# Patient Record
Sex: Female | Born: 1960 | Race: Black or African American | Hispanic: No | Marital: Single | Smoking: Current every day smoker
Health system: Southern US, Community
[De-identification: ages and names within clinical notes are randomized; demographics above are authoritative.]

## PROBLEM LIST (undated history)

## (undated) DIAGNOSIS — F4320 Adjustment disorder, unspecified: Secondary | ICD-10-CM

## (undated) DIAGNOSIS — N1831 Chronic kidney disease, stage 3a: Secondary | ICD-10-CM

## (undated) DIAGNOSIS — F1010 Alcohol abuse, uncomplicated: Secondary | ICD-10-CM

## (undated) DIAGNOSIS — F17200 Nicotine dependence, unspecified, uncomplicated: Secondary | ICD-10-CM

## (undated) DIAGNOSIS — F32A Depression, unspecified: Secondary | ICD-10-CM

## (undated) HISTORY — PX: ECTOPIC PREGNANCY SURGERY: SHX613

---

## 2005-07-15 ENCOUNTER — Emergency Department (HOSPITAL_COMMUNITY): Admission: EM | Admit: 2005-07-15 | Discharge: 2005-07-15 | Payer: Self-pay | Admitting: Emergency Medicine

## 2005-07-23 ENCOUNTER — Emergency Department (HOSPITAL_COMMUNITY): Admission: EM | Admit: 2005-07-23 | Discharge: 2005-07-23 | Payer: Self-pay | Admitting: Emergency Medicine

## 2005-08-07 ENCOUNTER — Emergency Department (HOSPITAL_COMMUNITY): Admission: EM | Admit: 2005-08-07 | Discharge: 2005-08-07 | Payer: Self-pay | Admitting: Emergency Medicine

## 2005-08-31 ENCOUNTER — Ambulatory Visit (HOSPITAL_COMMUNITY): Admission: RE | Admit: 2005-08-31 | Discharge: 2005-09-01 | Payer: Self-pay | Admitting: Otolaryngology

## 2006-09-12 ENCOUNTER — Emergency Department: Payer: Self-pay | Admitting: Emergency Medicine

## 2006-09-16 ENCOUNTER — Ambulatory Visit: Payer: Self-pay | Admitting: Plastic Surgery

## 2006-10-01 ENCOUNTER — Emergency Department: Payer: Self-pay | Admitting: Unknown Physician Specialty

## 2006-10-08 ENCOUNTER — Inpatient Hospital Stay: Payer: Self-pay | Admitting: Obstetrics & Gynecology

## 2011-12-06 ENCOUNTER — Emergency Department: Payer: Self-pay | Admitting: Emergency Medicine

## 2012-09-22 ENCOUNTER — Emergency Department: Payer: Self-pay | Admitting: Emergency Medicine

## 2012-09-22 LAB — URINALYSIS, COMPLETE
Glucose,UR: NEGATIVE mg/dL (ref 0–75)
Ketone: NEGATIVE
Ph: 6 (ref 4.5–8.0)
Protein: NEGATIVE
RBC,UR: 2 /HPF (ref 0–5)
WBC UR: 28 /HPF (ref 0–5)

## 2016-05-27 ENCOUNTER — Emergency Department
Admission: EM | Admit: 2016-05-27 | Discharge: 2016-05-27 | Disposition: A | Discharge status: Home / Self Care | Payer: Self-pay | Attending: Emergency Medicine | Admitting: Emergency Medicine

## 2016-05-27 ENCOUNTER — Encounter: Payer: Self-pay | Admitting: Emergency Medicine

## 2016-05-27 DIAGNOSIS — K047 Periapical abscess without sinus: Secondary | ICD-10-CM | POA: Insufficient documentation

## 2016-05-27 DIAGNOSIS — F17210 Nicotine dependence, cigarettes, uncomplicated: Secondary | ICD-10-CM | POA: Insufficient documentation

## 2016-05-27 MED ORDER — AMOXICILLIN 500 MG PO TABS: 500.0000 mg | ORAL_TABLET | Freq: Three times a day (TID) | ORAL | Status: DC

## 2016-05-27 MED ORDER — IBUPROFEN 600 MG PO TABS: 600.0000 mg | ORAL_TABLET | Freq: Four times a day (QID) | ORAL | Status: DC | PRN

## 2016-05-27 MED ORDER — MAGIC MOUTHWASH W/LIDOCAINE: 5.0000 mL | Freq: Four times a day (QID) | ORAL | Status: DC | PRN

## 2016-05-27 NOTE — ED Provider Notes (Signed)
[facility] Emergency Department Provider Note  ____________________________________________  Time seen: Approximately 11:13 AM  I have reviewed the triage vital signs and the nursing notes.   HISTORY  Chief Complaint Oral Swelling and Dental Pain    HPI Karen Mooney is a 55 y.o. female , NAD, presents to the emergency department with 3 day history of right lower dental pain with swelling. States she has had infections about her right lower gumline and teeth off and on for some time. Unfortunately does not have dental insurance to be able to see a dentist for further care. Over the last few days has noted increased swelling about the lower gumline with increasing pain. Has not been able to chew food on the right side due to the pain. Has been able to chew on the left side without any discomfort. Has not taken anything over-the-counter at this time. Denies any fevers, chills, body aches. Denies any injury or trauma to the face or teeth.   History reviewed. No pertinent past medical history.  There are no active problems to display for this patient.   Past Surgical History  Procedure Laterality Date  . Ectopic pregnancy surgery      Current Outpatient Rx  Name  Route  Sig  Dispense  Refill  . amoxicillin (AMOXIL) 500 MG tablet   Oral   Take 1 tablet (500 mg total) by mouth 3 (three) times daily with meals.   30 tablet   0   . ibuprofen (ADVIL,MOTRIN) 600 MG tablet   Oral   Take 1 tablet (600 mg total) by mouth every 6 (six) hours as needed.   30 tablet   0   . magic mouthwash w/lidocaine SOLN   Oral   Take 5 mLs by mouth 4 (four) times daily as needed for mouth pain.   240 mL   0     Please mix 80mL diphenhydramine, 80mL nystatin, 80 ...     Allergies Review of patient's allergies indicates no known allergies.  History reviewed. No pertinent family history.  Social History Social History  Substance Use Topics  . Smoking status:  Current Every Day Smoker -- 1.00 packs/day    Types: Cigarettes  . Smokeless tobacco: None  . Alcohol Use: Yes     Review of Systems  Constitutional: No fever/chills ENT: Positive right lower dental pain causing right ear pain.  Cardiovascular: No chest pain. Respiratory: No shortness of breath.  Gastrointestinal: No abdominal pain.  No nausea, vomiting. Musculoskeletal: Positive right lower jaw pain. Negative for neck pain.  Skin: Positive swelling right lower jaw. Negative for rash, skin sores. Neurological: Negative for headaches, focal weakness or numbness. No tingling 10-point ROS otherwise negative.  ____________________________________________   PHYSICAL EXAM:  VITAL SIGNS: ED Triage Vitals  Enc Vitals Group     BP 05/27/16 1106 149/79 mmHg     Pulse Rate 05/27/16 1106 93     Resp 05/27/16 1106 18     Temp 05/27/16 1106 98.4 F (36.9 C)     Temp Source 05/27/16 1106 Oral     SpO2 05/27/16 1106 98 %     Weight 05/27/16 1106 129 lb (58.514 kg)     Height 05/27/16 1106  (1.676 m)     Head Cir --      Peak Flow --      Pain Score 05/27/16 1107 10     Pain Loc --      Pain Edu? --  Excl. in GC? --      Constitutional: Alert and oriented. Well appearing and in no acute distress. Eyes: Conjunctivae are normal.  Head: Atraumatic. ENT:            Nose: No congestion/rhinnorhea.      Mouth/Throat: Poor dentition with multiple cavities throughout the mouth. Right lower gumline mildly erythematous with moderate swelling on the lateral right lower gumline. Active oozing or weeping. No broken teeth.  Neck: Supple with full range of motion. Hematological/Lymphatic/Immunilogical: No cervical lymphadenopathy. Respiratory: Normal respiratory effort without tachypnea or retractions.  Musculoskeletal: Mild tenderness to palpation over the right lower jawline with mild focal swelling about the area of tooth #29-31. Neurologic:  Normal speech and language. No gross  focal neurologic deficits are appreciated. Gait and posture are normal Skin:  Skin is warm, dry and intact. No rash noted. Psychiatric: Mood and affect are normal. Speech and behavior are normal. Patient exhibits appropriate insight and judgement.   ____________________________________________   LABS  None ____________________________________________  EKG  None ____________________________________________  RADIOLOGY  None ____________________________________________    PROCEDURES  Procedure(s) performed: None   Medications - No data to display   ____________________________________________   INITIAL IMPRESSION / ASSESSMENT AND PLAN / ED COURSE  Patient's diagnosis is consistent with dental abscess. Patient will be discharged home with prescriptions for amoxicillin, Magic mouthwash with lidocaine and ibuprofen to take as directed. Patient is to follow up with [facility] in 48 hours for recheck. Patient is given ED precautions to return to the ED for any worsening or new symptoms.    ____________________________________________  FINAL CLINICAL IMPRESSION(S) / ED DIAGNOSES  Final diagnoses:  Dental abscess      NEW MEDICATIONS STARTED DURING THIS VISIT:  New Prescriptions   AMOXICILLIN (AMOXIL) 500 MG TABLET    Take 1 tablet (500 mg total) by mouth 3 (three) times daily with meals.   IBUPROFEN (ADVIL,MOTRIN) 600 MG TABLET    Take 1 tablet (600 mg total) by mouth every 6 (six) hours as needed.   MAGIC MOUTHWASH W/LIDOCAINE SOLN    Take 5 mLs by mouth 4 (four) times daily as needed for mouth pain.         Hope PigeonJami L Jaila Schellhorn, PA-C 05/27/16 1128  Myrna Blazeravid Matthew Schaevitz, MD 05/27/16 [phone]

## 2016-05-27 NOTE — Discharge Instructions (Signed)

## 2016-05-27 NOTE — ED Notes (Signed)
Pt states tooth pain and swelling started on Thursday.  Pt states hard to chew food due to pain.

## 2020-07-25 ENCOUNTER — Encounter: Payer: Self-pay | Admitting: Emergency Medicine

## 2020-07-25 ENCOUNTER — Emergency Department
Admission: EM | Admit: 2020-07-25 | Discharge: 2020-07-25 | Disposition: A | Discharge status: Home / Self Care | Payer: Self-pay | Attending: Emergency Medicine | Admitting: Emergency Medicine

## 2020-07-25 ENCOUNTER — Other Ambulatory Visit: Payer: Self-pay

## 2020-07-25 DIAGNOSIS — R109 Unspecified abdominal pain: Secondary | ICD-10-CM | POA: Insufficient documentation

## 2020-07-25 DIAGNOSIS — R300 Dysuria: Secondary | ICD-10-CM

## 2020-07-25 DIAGNOSIS — E876 Hypokalemia: Secondary | ICD-10-CM | POA: Insufficient documentation

## 2020-07-25 DIAGNOSIS — F17210 Nicotine dependence, cigarettes, uncomplicated: Secondary | ICD-10-CM | POA: Insufficient documentation

## 2020-07-25 DIAGNOSIS — N3001 Acute cystitis with hematuria: Secondary | ICD-10-CM | POA: Insufficient documentation

## 2020-07-25 DIAGNOSIS — R110 Nausea: Secondary | ICD-10-CM | POA: Insufficient documentation

## 2020-07-25 DIAGNOSIS — I10 Essential (primary) hypertension: Secondary | ICD-10-CM | POA: Insufficient documentation

## 2020-07-25 DIAGNOSIS — Z5321 Procedure and treatment not carried out due to patient leaving prior to being seen by health care provider: Secondary | ICD-10-CM | POA: Insufficient documentation

## 2020-07-25 LAB — URINALYSIS, COMPLETE (UACMP) WITH MICROSCOPIC
Bilirubin Urine: NEGATIVE
Glucose, UA: NEGATIVE mg/dL
Ketones, ur: NEGATIVE mg/dL
Nitrite: NEGATIVE
Protein, ur: >=300 mg/dL — AB
RBC / HPF: >50 RBC/hpf — ABNORMAL HIGH (ref 0–5)
Specific Gravity, Urine: 1.011 (ref 1.005–1.030)
WBC, UA: >50 WBC/hpf — ABNORMAL HIGH (ref 0–5)
pH: 7 (ref 5.0–8.0)

## 2020-07-25 LAB — CBC
HCT: 42 % (ref 36.0–46.0)
Hemoglobin: 13.8 g/dL (ref 12.0–15.0)
MCH: 32.5 pg (ref 26.0–34.0)
MCHC: 32.9 g/dL (ref 30.0–36.0)
MCV: 99.1 fL (ref 80.0–100.0)
Platelets: 343 10*3/uL (ref 150–400)
RBC: 4.24 MIL/uL (ref 3.87–5.11)
RDW: 13.6 % (ref 11.5–15.5)
WBC: 12.3 10*3/uL — ABNORMAL HIGH (ref 4.0–10.5)
nRBC: 0 % (ref 0.0–0.2)

## 2020-07-25 LAB — BASIC METABOLIC PANEL
Anion gap: 9 (ref 5–15)
BUN: 11 mg/dL (ref 6–20)
CO2: 26 mmol/L (ref 22–32)
Calcium: 9.1 mg/dL (ref 8.9–10.3)
Chloride: 108 mmol/L (ref 98–111)
Creatinine, Ser: 0.87 mg/dL (ref 0.44–1.00)
GFR calc Af Amer: >60 mL/min (ref >60)
GFR calc non Af Amer: >60 mL/min (ref >60)
Glucose, Bld: 98 mg/dL (ref 70–99)
Potassium: 3.4 mmol/L — ABNORMAL LOW (ref 3.5–5.1)
Sodium: 143 mmol/L (ref 135–145)

## 2020-07-25 MED ORDER — CEPHALEXIN 500 MG PO CAPS
500.0000 mg | ORAL_CAPSULE | Freq: Once | ORAL | Status: AC
  Administered 2020-07-25: 500 mg via ORAL
  Filled 2020-07-25: qty 1

## 2020-07-25 MED ORDER — CEPHALEXIN 500 MG PO CAPS: 500.0000 mg | ORAL_CAPSULE | Freq: Four times a day (QID) | ORAL | 0 refills | Status: AC

## 2020-07-25 MED ORDER — POTASSIUM CHLORIDE CRYS ER 20 MEQ PO TBCR
40.0000 meq | EXTENDED_RELEASE_TABLET | Freq: Once | ORAL | Status: AC
  Administered 2020-07-25: 40 meq via ORAL
  Filled 2020-07-25: qty 2

## 2020-07-25 NOTE — ED Triage Notes (Signed)
Pt reports left flank pain and pain when urinating worsening today.

## 2020-07-25 NOTE — ED Triage Notes (Signed)
Patient with complaint of left flank pain and pain with urination times two weeks. Patient states that the pain became worse this morning.

## 2020-07-25 NOTE — ED Provider Notes (Signed)
[facility] Emergency Department Provider Note ____________________________________________   First MD Initiated Contact with Patient 07/25/20 1327     (approximate)  I have reviewed the triage vital signs and the nursing notes.  HISTORY  Chief Complaint No chief complaint on file.   HPI Karen Mooney is a 59 y.o. female presents to the ED for evaluation of right-sided abdominal and flank pain.  Chart review indicates no significant medical history.  Patient reports 2 weeks of progressively worsening right lower quadrant abdominal/flank pain and dysuria.  Reports it was "real bad" this morning, so she presented ED for evaluation.  She denies associated fevers, vomiting, diarrhea or migratory abdominal pain.  Denies chest pain, syncope, shortness of breath, cough or headache. She does report some associated nausea without vomiting, and she was able to eat today. She reports 7/10 intensity urethral pain and right flank pain with urination, improved to 3/10 intensity without urination.  She has not taken any medications to relieve this pain.    History reviewed. No pertinent past medical history.  There are no problems to display for this patient.   Past Surgical History:  Procedure Laterality Date  . ECTOPIC PREGNANCY SURGERY      Prior to Admission medications   Medication Sig Start Date End Date Taking? Authorizing Provider  cephALEXin (KEFLEX) 500 MG capsule Take 1 capsule (500 mg total) by mouth 4 (four) times daily for 5 days. 07/25/20 07/30/20  Delton Prairie, MD    Allergies Patient has no known allergies.  No family history on file.  Social History Social History   Tobacco Use  . Smoking status: Current Every Day Smoker    Packs/day: 1.00    Types: Cigarettes  . Smokeless tobacco: Never Used  Substance Use Topics  . Alcohol use: Yes  . Drug use: Never    Review of Systems  Constitutional: No fever/chills Eyes: No visual  changes. ENT: No sore throat. Cardiovascular: Denies chest pain. Respiratory: Denies shortness of breath. Gastrointestinal: Positive for RLQ pain and nausea. no vomiting.  No diarrhea.  No constipation. Genitourinary: Positive for dysuria Musculoskeletal: Negative for back pain. Skin: Negative for rash. Neurological: Negative for headaches, focal weakness or numbness.   ____________________________________________   PHYSICAL EXAM:  VITAL SIGNS: Vitals:   07/25/20 1314  BP: (!) 203/57  Pulse: 93  Resp: 18  Temp: 98.3 F (36.8 C)  SpO2: 100%      Constitutional: Alert and oriented. Well appearing and in no acute distress.  Sitting up in the side of the bed when into the room, pleasant and conversational full sentences. Eyes: Conjunctivae are normal. PERRL. EOMI. Head: Atraumatic. Nose: No congestion/rhinnorhea. Mouth/Throat: Mucous membranes are moist.  Oropharynx non-erythematous. Neck: No stridor. No cervical spine tenderness to palpation. Cardiovascular: Normal rate, regular rhythm. Grossly normal heart sounds.  Good peripheral circulation. Respiratory: Normal respiratory effort.  No retractions. Lungs CTAB. Gastrointestinal: Soft , nondistended. No abdominal bruits. No CVA tenderness. Minimal suprapubic tenderness without peritoneal signs.  No RLQ tenderness.  And abdomen is otherwise benign. Musculoskeletal: No lower extremity tenderness nor edema.  No joint effusions. No signs of acute trauma. Neurologic:  Normal speech and language. No gross focal neurologic deficits are appreciated. No gait instability noted. Skin:  Skin is warm, dry and intact. No rash noted. Psychiatric: Mood and affect are normal. Speech and behavior are normal.  ____________________________________________   LABS (all labs ordered are listed, but only abnormal results are displayed)  Labs Reviewed - No  data to display   Labs reviewed, under separate encounter because patient had to get  replaced into the system.  Indicate a mild leukocytosis to 12.3, otherwise unremarkable CBC.  BMP with mild hypokalemia 3.4, otherwise unremarkable.  UA with infectious features. ____________________________________________   PROCEDURES and INTERVENTIONS  Procedure(s) performed (including Critical Care):  Procedures  Medications  cephALEXin (KEFLEX) capsule 500 mg (has no administration in time range)  potassium chloride SA (KLOR-CON) CR tablet 40 mEq (has no administration in time range)    ____________________________________________   INITIAL IMPRESSION / ASSESSMENT AND PLAN / ED COURSE  59 year old woman presenting with dysuria and right flank pain most consistent with acute cystitis and amenable to outpatient management.  Mild suprapubic tenderness without peritoneal features, otherwise benign abdominal exam.  Patient is otherwise well-appearing.  Hypertensive in triage, likely chronic in nature and without evidence of endorgan damage, CVA and no symptoms of hypertensive emergency.  Start the patient on 5-day course of Keflex 4 times daily to treat acute cystitis.  Provided single dose of p.o. potassium supplementation due to her noted hypokalemia.  Advised outpatient follow-up in the next 5 days to ensure improving symptoms.  Return precautions for the ED were discussed.  Patient medically stable discharge home. ____________________________________________   FINAL CLINICAL IMPRESSION(S) / ED DIAGNOSES  Final diagnoses:  Dysuria  Essential hypertension  Acute cystitis with hematuria  Hypokalemia     ED Discharge Orders         Ordered    cephALEXin (KEFLEX) 500 MG capsule  4 times daily     Discontinue  Reprint     07/25/20 1435           Deeann Servidio Katrinka Blazing   Note:  This document was prepared using Conservation officer, historic buildings and may include unintentional dictation errors.   Delton Prairie, MD 07/25/20 [phone]

## 2020-07-25 NOTE — Discharge Instructions (Signed)
You were seen in the ED because of urinary pain.  You have evidence of a urinary tract infection/bladder infection.  We will start you on a 5-day course of antibiotics called Keflex.  Please take this medication 4 times per day for the next 5 days, please finish all 20 pills, even if you are feeling better.  This prescription is waiting for you at the [facility] pharmacy on [address]  If you develop any worsening symptoms despite these medications, including fevers, worsening back pain or uncontrolled vomiting, please return to the ED.

## 2021-04-10 ENCOUNTER — Ambulatory Visit (LOCAL_COMMUNITY_HEALTH_CENTER): Payer: Self-pay

## 2021-04-10 ENCOUNTER — Other Ambulatory Visit: Payer: Self-pay

## 2021-04-10 DIAGNOSIS — Z111 Encounter for screening for respiratory tuberculosis: Secondary | ICD-10-CM

## 2021-04-13 ENCOUNTER — Ambulatory Visit (LOCAL_COMMUNITY_HEALTH_CENTER): Payer: Self-pay

## 2021-04-13 ENCOUNTER — Other Ambulatory Visit: Payer: Self-pay

## 2021-04-13 DIAGNOSIS — Z111 Encounter for screening for respiratory tuberculosis: Secondary | ICD-10-CM

## 2021-04-13 LAB — TB SKIN TEST
Induration: 0 mm
TB Skin Test: NEGATIVE

## 2021-08-26 ENCOUNTER — Inpatient Hospital Stay
Admission: EM | Admit: 2021-08-26 | Discharge: 2021-09-01 | DRG: 640 | Disposition: A | Discharge status: Home / Self Care | Payer: Self-pay | Attending: Internal Medicine | Admitting: Internal Medicine

## 2021-08-26 DIAGNOSIS — Z87440 Personal history of urinary (tract) infections: Secondary | ICD-10-CM

## 2021-08-26 DIAGNOSIS — R634 Abnormal weight loss: Secondary | ICD-10-CM | POA: Diagnosis present

## 2021-08-26 DIAGNOSIS — N179 Acute kidney failure, unspecified: Secondary | ICD-10-CM | POA: Diagnosis present

## 2021-08-26 DIAGNOSIS — N136 Pyonephrosis: Secondary | ICD-10-CM | POA: Diagnosis present

## 2021-08-26 DIAGNOSIS — E871 Hypo-osmolality and hyponatremia: Principal | ICD-10-CM | POA: Diagnosis present

## 2021-08-26 DIAGNOSIS — E46 Unspecified protein-calorie malnutrition: Secondary | ICD-10-CM

## 2021-08-26 DIAGNOSIS — Z87442 Personal history of urinary calculi: Secondary | ICD-10-CM

## 2021-08-26 DIAGNOSIS — R319 Hematuria, unspecified: Secondary | ICD-10-CM | POA: Diagnosis present

## 2021-08-26 DIAGNOSIS — E860 Dehydration: Secondary | ICD-10-CM | POA: Diagnosis present

## 2021-08-26 DIAGNOSIS — Z28310 Unvaccinated for covid-19: Secondary | ICD-10-CM

## 2021-08-26 DIAGNOSIS — Z681 Body mass index (BMI) 19 or less, adult: Secondary | ICD-10-CM

## 2021-08-26 DIAGNOSIS — T730XXS Starvation, sequela: Secondary | ICD-10-CM | POA: Diagnosis present

## 2021-08-26 DIAGNOSIS — E43 Unspecified severe protein-calorie malnutrition: Secondary | ICD-10-CM | POA: Insufficient documentation

## 2021-08-26 DIAGNOSIS — R631 Polydipsia: Secondary | ICD-10-CM | POA: Diagnosis present

## 2021-08-26 DIAGNOSIS — T730XXA Starvation, initial encounter: Secondary | ICD-10-CM | POA: Diagnosis present

## 2021-08-26 DIAGNOSIS — F32A Depression, unspecified: Secondary | ICD-10-CM | POA: Diagnosis present

## 2021-08-26 DIAGNOSIS — Z20822 Contact with and (suspected) exposure to covid-19: Secondary | ICD-10-CM | POA: Diagnosis present

## 2021-08-26 DIAGNOSIS — E876 Hypokalemia: Secondary | ICD-10-CM | POA: Diagnosis not present

## 2021-08-26 DIAGNOSIS — E872 Acidosis: Secondary | ICD-10-CM | POA: Diagnosis present

## 2021-08-26 DIAGNOSIS — E875 Hyperkalemia: Secondary | ICD-10-CM | POA: Diagnosis present

## 2021-08-26 DIAGNOSIS — F4320 Adjustment disorder, unspecified: Secondary | ICD-10-CM

## 2021-08-26 DIAGNOSIS — R112 Nausea with vomiting, unspecified: Secondary | ICD-10-CM | POA: Diagnosis present

## 2021-08-26 DIAGNOSIS — F17210 Nicotine dependence, cigarettes, uncomplicated: Secondary | ICD-10-CM | POA: Diagnosis present

## 2021-08-26 DIAGNOSIS — X58XXXA Exposure to other specified factors, initial encounter: Secondary | ICD-10-CM | POA: Diagnosis present

## 2021-08-26 DIAGNOSIS — N170 Acute kidney failure with tubular necrosis: Secondary | ICD-10-CM | POA: Diagnosis present

## 2021-08-26 DIAGNOSIS — Z720 Tobacco use: Secondary | ICD-10-CM | POA: Diagnosis present

## 2021-08-26 DIAGNOSIS — F430 Acute stress reaction: Secondary | ICD-10-CM | POA: Diagnosis present

## 2021-08-26 LAB — COMPREHENSIVE METABOLIC PANEL
ALT: 8 U/L (ref 0–44)
AST: 14 U/L — ABNORMAL LOW (ref 15–41)
Albumin: 3.9 g/dL (ref 3.5–5.0)
Alkaline Phosphatase: 108 U/L (ref 38–126)
Anion gap: 20 — ABNORMAL HIGH (ref 5–15)
BUN: 127 mg/dL — ABNORMAL HIGH (ref 6–20)
CO2: 16 mmol/L — ABNORMAL LOW (ref 22–32)
Calcium: 9.6 mg/dL (ref 8.9–10.3)
Chloride: 67 mmol/L — ABNORMAL LOW (ref 98–111)
Creatinine, Ser: 9.46 mg/dL — ABNORMAL HIGH (ref 0.44–1.00)
GFR, Estimated: 4 mL/min — ABNORMAL LOW (ref >60)
Glucose, Bld: 119 mg/dL — ABNORMAL HIGH (ref 70–99)
Potassium: 5.6 mmol/L — ABNORMAL HIGH (ref 3.5–5.1)
Sodium: 103 mmol/L — CL (ref 135–145)
Total Bilirubin: 0.8 mg/dL (ref 0.3–1.2)
Total Protein: 9.5 g/dL — ABNORMAL HIGH (ref 6.5–8.1)

## 2021-08-26 LAB — CBC WITH DIFFERENTIAL/PLATELET
Abs Immature Granulocytes: 0.35 10*3/uL — ABNORMAL HIGH (ref 0.00–0.07)
Basophils Absolute: 0 10*3/uL (ref 0.0–0.1)
Basophils Relative: 0 %
Eosinophils Absolute: 0 10*3/uL (ref 0.0–0.5)
Eosinophils Relative: 0 %
HCT: 37 % (ref 36.0–46.0)
Hemoglobin: 13.7 g/dL (ref 12.0–15.0)
Immature Granulocytes: 3 %
Lymphocytes Relative: 11 %
Lymphs Abs: 1.3 10*3/uL (ref 0.7–4.0)
MCH: 33.1 pg (ref 26.0–34.0)
MCHC: 37 g/dL — ABNORMAL HIGH (ref 30.0–36.0)
MCV: 89.4 fL (ref 80.0–100.0)
Monocytes Absolute: 0.6 10*3/uL (ref 0.1–1.0)
Monocytes Relative: 5 %
Neutro Abs: 10 10*3/uL — ABNORMAL HIGH (ref 1.7–7.7)
Neutrophils Relative %: 81 %
Platelets: 704 10*3/uL — ABNORMAL HIGH (ref 150–400)
RBC: 4.14 MIL/uL (ref 3.87–5.11)
RDW: 11.8 % (ref 11.5–15.5)
WBC: 12.3 10*3/uL — ABNORMAL HIGH (ref 4.0–10.5)
nRBC: 0 % (ref 0.0–0.2)

## 2021-08-26 LAB — ETHANOL: Alcohol, Ethyl (B): <10 mg/dL (ref <10)

## 2021-08-26 LAB — LIPASE, BLOOD: Lipase: 33 U/L (ref 11–51)

## 2021-08-26 MED ORDER — THIAMINE HCL 100 MG/ML IJ SOLN: 500.0000 mg | INTRAVENOUS | Status: AC

## 2021-08-26 MED ORDER — LACTATED RINGERS IV BOLUS
1000.0000 mL | Freq: Once | INTRAVENOUS | Status: AC
  Administered 2021-08-26: 1000 mL via INTRAVENOUS

## 2021-08-26 MED ORDER — SODIUM BICARBONATE 8.4 % IV SOLN
INTRAVENOUS | Status: DC
  Filled 2021-08-26: qty 150
  Filled 2021-08-26: qty 1000
  Filled 2021-08-26: qty 150

## 2021-08-26 NOTE — ED Provider Notes (Signed)
[facility] Emergency Department Provider Note  ____________________________________________   Event Date/Time   First MD Initiated Contact with Patient 08/26/21 2157     (approximate)  I have reviewed the triage vital signs and the nursing notes.   HISTORY  Chief Complaint Psychiatric Evaluation (Not eating pt stated for 2 weeks)    HPI Karen Mooney is a 60 y.o. female here with poor p.o. appetite and generalized weakness.  The patient reportedly has had a fairly complicated recent history including recurrent UTIs for which she has been on multiple doses of antibiotics.  Over the last 2 weeks, she reportedly has been eating and drinking less and less and has essentially refused to eat.  She tells me that this is because whenever she does eat, she gets nauseous and immediately vomits.  She states she would like to eat but just gets so nauseous that she does not want to anymore.  Per IVC paperwork, family was concerned about patient intentionally not eating and she had made statements that she just wanted to die.  She denies overt intent of self-harm or suicide ideation on my assessment.  She does state that she feels generally fatigued and has been very weak.  Denies any fevers.  No recent medication changes.  No other complaints.    History reviewed. No pertinent past medical history.  There are no problems to display for this patient.   Past Surgical History:  Procedure Laterality Date   ECTOPIC PREGNANCY SURGERY      Prior to Admission medications   Not on File    Allergies Patient has no known allergies.  History reviewed. No pertinent family history.  Social History Social History   Tobacco Use   Smoking status: Every Day    Packs/day: 1.00    Types: Cigarettes   Smokeless tobacco: Never  Substance Use Topics   Alcohol use: Yes   Drug use: Never    Review of Systems  Review of Systems  Constitutional:  Positive for fatigue.  Negative for fever.  HENT:  Negative for congestion and sore throat.   Eyes:  Negative for visual disturbance.  Respiratory:  Negative for cough and shortness of breath.   Cardiovascular:  Negative for chest pain.  Gastrointestinal:  Positive for nausea and vomiting. Negative for abdominal pain and diarrhea.  Genitourinary:  Negative for flank pain.  Musculoskeletal:  Negative for back pain and neck pain.  Skin:  Negative for rash and wound.  Neurological:  Positive for weakness.  All other systems reviewed and are negative.   ____________________________________________  PHYSICAL EXAM:      VITAL SIGNS: ED Triage Vitals  Enc Vitals Group     BP 08/26/21 2149 130/79     Pulse Rate 08/26/21 2149 96     Resp 08/26/21 2149 16     Temp 08/26/21 2149 97.6 F (36.4 C)     Temp Source 08/26/21 2149 Oral     SpO2 08/26/21 2149 97 %     Weight 08/26/21 2148 103 lb 9.9 oz (47 kg)     Height 08/26/21 2148 5\' 5"  (1.651 m)     Head Circumference --      Peak Flow --      Pain Score 08/26/21 2148 0     Pain Loc --      Pain Edu? --      Excl. in GC? --      Physical Exam Vitals and nursing note reviewed.  Constitutional:      General: She is not in acute distress.    Appearance: She is well-developed.  HENT:     Head: Normocephalic and atraumatic.     Mouth/Throat:     Mouth: Mucous membranes are dry.  Eyes:     Conjunctiva/sclera: Conjunctivae normal.  Cardiovascular:     Rate and Rhythm: Normal rate and regular rhythm.     Heart sounds: Normal heart sounds.  Pulmonary:     Effort: Pulmonary effort is normal. No respiratory distress.     Breath sounds: No wheezing.  Abdominal:     General: There is no distension.  Musculoskeletal:     Cervical back: Neck supple.  Skin:    General: Skin is warm.     Capillary Refill: Capillary refill takes less than 2 seconds.     Findings: No rash.  Neurological:     Mental Status: She is alert and oriented to person, place, and  time.     Motor: No abnormal muscle tone.  Psychiatric:        Thought Content: Thought content does not include suicidal ideation. Thought content does not include suicidal plan.      ____________________________________________   LABS (all labs ordered are listed, but only abnormal results are displayed)  Labs Reviewed  CBC WITH DIFFERENTIAL/PLATELET - Abnormal; Notable for the following components:      Result Value   WBC 12.3 (*)    MCHC 37.0 (*)    Platelets 704 (*)    Neutro Abs 10.0 (*)    Abs Immature Granulocytes 0.35 (*)    All other components within normal limits  COMPREHENSIVE METABOLIC PANEL - Abnormal; Notable for the following components:   Sodium 103 (*)    Potassium 5.6 (*)    Chloride 67 (*)    CO2 16 (*)    Glucose, Bld 119 (*)    BUN 127 (*)    Creatinine, Ser 9.46 (*)    Total Protein 9.5 (*)    AST 14 (*)    GFR, Estimated 4 (*)    Anion gap 20 (*)    All other components within normal limits  ETHANOL  LIPASE, BLOOD  URINALYSIS, ROUTINE W REFLEX MICROSCOPIC  TSH  OSMOLALITY  OSMOLALITY, URINE  SODIUM, URINE, RANDOM  MAGNESIUM  BASIC METABOLIC PANEL  BASIC METABOLIC PANEL    ____________________________________________  EKG: Normal sinus rhythm, VR 98. PR 158, QRS 88, QTc 418. ________________________________________  RADIOLOGY All imaging, including plain films, CT scans, and ultrasounds, independently reviewed by me, and interpretations confirmed via formal radiology reads.  ED MD interpretation:   CT: Pending  Official radiology report(s): No results found.  ____________________________________________  PROCEDURES   Procedure(s) performed (including Critical Care):  .Critical Care  Date/Time: 08/26/2021 11:42 PM Performed by: Shaune Pollack, MD Authorized by: Shaune Pollack, MD   Critical care provider statement:    Critical care time (minutes):  35   Critical care time was exclusive of:  Separately billable  procedures and treating other patients and teaching time   Critical care was necessary to treat or prevent imminent or life-threatening deterioration of the following conditions:  Metabolic crisis, circulatory failure and cardiac failure   Critical care was time spent personally by me on the following activities:  Development of treatment plan with patient or surrogate, discussions with consultants, evaluation of patient's response to treatment, examination of patient, obtaining history from patient or surrogate, ordering and performing treatments and interventions, ordering and review  of laboratory studies, ordering and review of radiographic studies, pulse oximetry, re-evaluation of patient's condition and review of old charts   I assumed direction of critical care for this patient from another provider in my specialty: no    ____________________________________________  INITIAL IMPRESSION / MDM / ASSESSMENT AND PLAN / ED COURSE  As part of my medical decision making, I reviewed the following data within the electronic MEDICAL RECORD NUMBER Nursing notes reviewed and incorporated, Old chart reviewed, Notes from prior ED visits, and [place] Controlled Substance Database       *Karen Mooney was evaluated in Emergency Department on 08/26/2021 for the symptoms described in the history of present illness. She was evaluated in the context of the global COVID-19 pandemic, which necessitated consideration that the patient might be at risk for infection with the SARS-CoV-2 virus that causes COVID-19. Institutional protocols and algorithms that pertain to the evaluation of patients at risk for COVID-19 are in a state of rapid change based on information released by regulatory bodies including the CDC and federal and state organizations. These policies and algorithms were followed during the patient's care in the ED.  Some ED evaluations and interventions may be delayed as a result of limited staffing during the  pandemic.*     Medical Decision Making: 60 year old female here with reported poor p.o. intake and generalized weakness.  She actually arrives via IVC for reportedly saying she wanted to stay at home and die.  I suspect her symptoms are more so secondary to general medical illness.  Her lab work today reveals profound hyponatremia as well as acute renal failure with BUN of 127 and creatinine of 9.46.  I suspect this is hypovolemic.  This appears new when compared to her sodium in the 140s approximately 1 month ago.  She received approximately 250 cc of lactated Ringer's.  I discussed with nephrology.  Will hold any additional boluses at this time and switch to D5 bicarb at 60 cc/h with serial sodiums every 4 hours.  They will see the patient as an inpatient.  Otherwise, serum awesome's and additional lab work added on.  Given her persistent vomiting, will obtain CT to evaluate for underlying mass or other abnormality.  Admit to medicine.  She has no seizures, tremors, or signs of neurological compromise at this time.  ____________________________________________  FINAL CLINICAL IMPRESSION(S) / ED DIAGNOSES  Final diagnoses:  Hyponatremia  AKI (acute kidney injury) (HCC)  Dehydration     MEDICATIONS GIVEN DURING THIS VISIT:  Medications  sodium bicarbonate 150 mEq in dextrose 5 % 1,150 mL infusion (has no administration in time range)  lactated ringers bolus 1,000 mL (1,000 mLs Intravenous Bolus 08/26/21 2315)     ED Discharge Orders     None        Note:  This document was prepared using Dragon voice recognition software and may include unintentional dictation errors.   Shaune Pollack, MD 08/26/21 2342

## 2021-08-26 NOTE — H&P (Addendum)
Karen Mooney MWN:[id number] DOB: May 20, 1961 DOA: 08/26/2021     PCP: Patient, No Pcp Per (Inactive)   Outpatient Specialists:  NONE    Patient arrived to ER on 08/26/21 at 2127 Referred by Attending Shaune Pollack, MD   Patient coming from: home Lives alone,     Chief Complaint:   Chief Complaint  Patient presents with   Psychiatric Evaluation    Not eating pt stated for 2 weeks    HPI: Karen Mooney is a 60 y.o. female with medical history significant of recurrent UTIs, tobacco abuse , weight loss    Presented with  p.o. appetite and generalized weakness Was seen at [facility] on 27 August for presumed UTI was given prescription for Bactrim and Pyridium UA at the time showed persistent infection Family has been concerned that patient has been losing weight and not eating. Family filed for IVC and brought patient to the hospital .  Patient states that i its not that she does not want to eat its  She has been on some antibiotic she is getting confused which is supposed to be taking. She does not have a regular primary care doctor  Pt reports she drinks large amounts of water and  rarely eats She had half a bag of single service chips 3 days ago States she vomits 3 times a day Continues to smoke used to drink etoh but have not had any for past 6 m  Have not had any mammogram denies having colonoscopy    Initial COVID TEST      in house  PCR testing  Pending  No results found for: SARSCOV2NAA    While in ER: Noted Na 103 creatinine elevated at 9 Nephrology rec no hot salts D5 Bicarb drip      ED Triage Vitals  Enc Vitals Group     BP 08/26/21 2149 130/79     Pulse Rate 08/26/21 2149 96     Resp 08/26/21 2149 16     Temp 08/26/21 2149 97.6 F (36.4 C)     Temp Source 08/26/21 2149 Oral     SpO2 08/26/21 2149 97 %     Weight 08/26/21 2148 103 lb 9.9 oz (47 kg)     Height 08/26/21 2148 5\' 5"  (1.651 m)     Head Circumference --      Peak Flow --      Pain  Score 08/26/21 2148 0     Pain Loc --      Pain Edu? --      Excl. in GC? --   TMAX(24)@     _________________________________________ Significant initial  Findings: Abnormal Labs Reviewed  CBC WITH DIFFERENTIAL/PLATELET - Abnormal; Notable for the following components:      Result Value   WBC 12.3 (*)    MCHC 37.0 (*)    Platelets 704 (*)    Neutro Abs 10.0 (*)    Abs Immature Granulocytes 0.35 (*)    All other components within normal limits  COMPREHENSIVE METABOLIC PANEL - Abnormal; Notable for the following components:   Sodium 103 (*)    Potassium 5.6 (*)    Chloride 67 (*)    CO2 16 (*)    Glucose, Bld 119 (*)    BUN 127 (*)    Creatinine, Ser 9.46 (*)    Total Protein 9.5 (*)    AST 14 (*)    GFR, Estimated 4 (*)    Anion gap 20 (*)  All other components within normal limits   ____________________________________________ Ordered    CXR -  NON acute  CTabd/pelvis -  non-acute, chronic hydroimproved    ECG: Ordered Personally reviewed by me showing: HR :98 Rhythm: NSR    no evidence of ischemic changes QTC 418 ____________   The recent clinical data is shown below. Vitals:   08/26/21 2148 08/26/21 2149 08/26/21 2330  BP:  130/79   Pulse:  96 90  Resp:  16 18  Temp:  97.6 F (36.4 C)   TempSrc:  Oral   SpO2:  97% 96%  Weight: 47 kg    Height: 5\' 5"  (1.651 m)    WBC     Component Value Date/Time   WBC 12.3 (H) 08/26/2021 2231   LYMPHSABS 1.3 08/26/2021 2231   MONOABS 0.6 08/26/2021 2231   EOSABS 0.0 08/26/2021 2231   BASOSABS 0.0 08/26/2021 2231  Lactic Acid, Venous    Component Value Date/Time   LATICACIDVEN 0.9 08/27/2021 0006      UA  increased protein, WBC in RBC   Urine analysis:    Component Value Date/Time   COLORURINE AMBER (A) 07/25/2020 0556   APPEARANCEUR TURBID (A) 07/25/2020 0556   APPEARANCEUR Hazy 09/22/2012 1315   LABSPEC 1.011 07/25/2020 0556   LABSPEC 1.002 09/22/2012 1315   PHURINE 7.0 07/25/2020 0556    GLUCOSEU NEGATIVE 07/25/2020 0556   GLUCOSEU Negative 09/22/2012 1315   HGBUR MODERATE (A) 07/25/2020 0556   BILIRUBINUR NEGATIVE 07/25/2020 0556   BILIRUBINUR Negative 09/22/2012 1315   KETONESUR NEGATIVE 07/25/2020 0556   PROTEINUR >=300 (A) 07/25/2020 0556   NITRITE NEGATIVE 07/25/2020 0556   LEUKOCYTESUR MODERATE (A) 07/25/2020 0556   LEUKOCYTESUR 2+ 09/22/2012 1315    No results found for this or any previous visit.   _______________________________________________________ ER Provider Called: Nephrology    Dr. Cherylann RatelLateef  They Recommend admit to medicine   Will see in AM    _______________________________________________ Hospitalist was called for admission for hyponatremia  The following Work up has been ordered so far:  Orders Placed This Encounter  Procedures   Critical Care   CT ABDOMEN PELVIS WO CONTRAST   CBC with Differential   Comprehensive metabolic panel   Urinalysis, Routine w reflex microscopic   Ethanol   Lipase, blood   TSH   Osmolality   Osmolality, urine   Sodium, urine, random   Magnesium   Basic metabolic panel   Basic metabolic panel   Diet regular Room service appropriate? Yes; Fluid consistency: Thin   Consult to hospitalist   ED EKG     Following Medications were ordered in ER: Medications  sodium bicarbonate 150 mEq in dextrose 5 % 1,150 mL infusion (has no administration in time range)  lactated ringers bolus 1,000 mL (1,000 mLs Intravenous Bolus 08/26/21 2315)        Consult Orders  (From admission, onward)           Start     Ordered   08/26/21 2340  Consult to hospitalist  Once       Provider:  (Not yet assigned)  Question Answer Comment  Place call to: Hospitalist   Reason for Consult Admit      08/26/21 2339             OTHER Significant initial  Findings:  labs showing:    Recent Labs  Lab 08/26/21 2231  NA 103*  K 5.6*  CO2 16*  GLUCOSE 119*  BUN 127*  CREATININE 9.46*  CALCIUM 9.6    Cr   Up  from baseline see below Lab Results  Component Value Date   CREATININE 9.46 (H) 08/26/2021   CREATININE 0.87 07/25/2020    Recent Labs  Lab 08/26/21 2231  AST 14*  ALT 8  ALKPHOS 108  BILITOT 0.8  PROT 9.5*  ALBUMIN 3.9   Lab Results  Component Value Date   CALCIUM 9.6 08/26/2021      Plt: Lab Results  Component Value Date   PLT 704 (H) 08/26/2021   Venous  Blood Gas result:  pH 7.34  pCO2 27    Recent Labs  Lab 08/26/21 2231  WBC 12.3*  NEUTROABS 10.0*  HGB 13.7  HCT 37.0  MCV 89.4  PLT 704*    HG/HCT  stable,       Component Value Date/Time   HGB 13.7 08/26/2021 2231   HCT 37.0 08/26/2021 2231   MCV 89.4 08/26/2021 2231     Recent Labs  Lab 08/26/21 2231  LIPASE 33        Cultures: No results found for: SDES, SPECREQUEST, CULT, REPTSTATUS   Radiological Exams on Admission: No results found. _______________________________________________________________________________________________________ Latest  Blood pressure 130/79, pulse 90, temperature 97.6 F (36.4 C), temperature source Oral, resp. rate 18, height 5\' 5"  (1.651 m), weight 47 kg, SpO2 96 %.   Review of Systems:    Pertinent positives include:  fatigue, weight loss   Constitutional:  No weight loss, night sweats, Fevers, chills,  HEENT:  No headaches, Difficulty swallowing,Tooth/dental problems,Sore throat,  No sneezing, itching, ear ache, nasal congestion, post nasal drip,  Cardio-vascular:  No chest pain, Orthopnea, PND, anasarca, dizziness, palpitations.no Bilateral lower extremity swelling  GI:  No heartburn, indigestion, abdominal pain, nausea, vomiting, diarrhea, change in bowel habits, loss of appetite, melena, blood in stool, hematemesis Resp:  no shortness of breath at rest. No dyspnea on exertion, No excess mucus, no productive cough, No non-productive cough, No coughing up of blood.No change in color of mucus.No wheezing. Skin:  no rash or lesions. No jaundice GU:   no dysuria, change in color of urine, no urgency or frequency. No straining to urinate.  No flank pain.  Musculoskeletal:  No joint pain or no joint swelling. No decreased range of motion. No back pain.  Psych:  No change in mood or affect. No depression or anxiety. No memory loss.  Neuro: no localizing neurological complaints, no tingling, no weakness, no double vision, no gait abnormality, no slurred speech, no confusion  All systems reviewed and apart from HOPI all are negative _______________________________________________________________________________________________ Past Medical History:  History reviewed. No pertinent past medical history.    Past Surgical History:  Procedure Laterality Date   ECTOPIC PREGNANCY SURGERY      Social History:  Ambulatory   independently      reports that she has been smoking cigarettes. She has been smoking an average of 1 pack per day. She has never used smokeless tobacco. She reports current alcohol use. She reports that she does not use drugs.   Family History:   Family History  Problem Relation Age of Onset   CAD Neg Hx    Diabetes Neg Hx    ______________________________________________________________________________________________ Allergies: No Known Allergies   Prior to Admission medications   Medication Sig Start Date End Date Taking? Authorizing Provider  sulfamethoxazole-trimethoprim (BACTRIM DS) 800-160 MG tablet Take 1 tablet by mouth 2 (two) times daily. For 10 days. 08/19/21 08/29/21 Yes [provider]  ondansetron (ZOFRAN-ODT) 4 MG disintegrating tablet Take 4 mg by mouth every 8 (eight) hours as needed for nausea. For up to 7 days. 08/19/21 08/26/21  [provider]    ___________________________________________________________________________________________________ Physical Exam: Vitals with BMI 08/26/2021 08/26/2021 08/26/2021  Height - -   Weight - - 103 lbs 10 oz  BMI - - 17.24  Systolic -  130 -  Diastolic - 79 -  Pulse 90 96 -     1. General:  in No  Acute distress   Chronically ill  cachectic   -appearing 2. Psychological: Alert and  Oriented, flat affect 3. Head/ENT:   Moist   Mucous Membranes                          Head Non traumatic, neck supple                           Poor Dentition 4. SKIN:  decreased Skin turgor,  Skin clean Dry and intact no rash 5. Heart: Regular rate and rhythm no   Murmur, no Rub or gallop 6. Lungs:  Clear to auscultation bilaterally, no wheezes or crackles   7. Abdomen: Soft,  non-tender, Non distended  bowel sounds present 8. Lower extremities: no clubbing, cyanosis, no  edema 9. Neurologically Grossly intact, moving all 4 extremities equally  intact 10. MSK: Normal range of motion    Chart has been reviewed  ______________________________________________________________________________________________  Assessment/Plan  60 y.o. female with medical history significant of recurrent UTIs, tobacco abuse , weight loss   Admitted for AKI and hyponatremia  Present on Admission:  Hyponatremia - combination of decreased PO intake, dehydration and high water intake Per nephrology will start on bicarb drip and follow serial labs Admit to stepdown and monitor carefully Nephrology advised against 3% saline at this time   AKI (acute kidney injury) (HCC) in the setting of dehydration but also showed evaluate for possible nephritis given persistent hematuria UA currently showing no bacteria but there is persistent leukocytes and RBCs Appreciate nephrology consult Given elevated protein Check SPEP, ANA CRP sed rate Bactrim can at times increase creatinine and potassium will stop at this point no evidence of UTI although there is some yeast in urine patient is monitor worsening any urinary complaints  Persistent nausea and vomiting states she cannot tolerate any p.o.  It is unclear when patient developed AKI but uremia could be causing her  nausea and vomiting   CT abdomen unremarkable except for improving hydronephrosis.  Order swallow study. Would evaluate for central nausea vomiting. Treat supportively Reglan for now may need abdominal emptying study   Hematuria -CT abdomen showing no ureteral stones.  Would need to evaluate for glomerulonephritis Appreciate nephrology consult   Weight loss -states this is secondary to decreased p.o. intake.  Also order behavioral health consult to evaluate for any possibility of eating disorder   Malnutrition due to starvation [facility]) Order nutritional consult check prealbumin check electrolytes and replete as needed Administer thiamin   Hyperkalemia -improving with fluid resuscitation No EKG changes   Tobacco abuse -  - Spoke about importance of quitting spent 5 minutes discussing options for treatment, prior attempts at quitting, and dangers of smoking  -At this point patient is   NOT  interested in quitting  - order nicotine patch   - nursing tobacco cessation protocol    Other plan as per orders.  DVT prophylaxis:  SCD    Code Status:    Code Status: Not on file FULL CODE      Family Communication:   Family not at  Bedside    Disposition Plan:     likely will need placement for rehabilitation patient is stable   Following barriers for discharge:                            Electrolytes corrected                                               Will need to be able to tolerate PO                            Will likely need home health, home O2, set up                           Will need consultants to evaluate patient prior to discharge                  Would benefit from PT/OT eval prior to DC  Ordered                   Swallow eval - SLP ordered                                      Transition of care consulted                   Nutrition    consulted                                     Behavioral health  consulted                    Consults called: nephrology     Admission status:  ED Disposition     ED Disposition  Admit   Condition  --   Comment  The patient appears reasonably stabilized for admission considering the current resources, flow, and capabilities available in the ED at this time, and I doubt any other [facility] requiring further screening and/or treatment in the ED prior to admission is  present.            inpatient     I Expect 2 midnight stay secondary to severity of patient's current illness need for inpatient interventions justified by the following:  Severe lab/radiological/exam abnormalities including:    hyponatremia and extensive comorbidities including:  substance abuse    That are currently affecting medical management.   I expect  patient to be hospitalized for 2 midnights requiring inpatient medical care.  Patient is at high risk for adverse outcome (such as loss of life or disability) if not treated.  Indication for inpatient stay as follows:  Severe change from baseline regarding mental status   inability to maintain oral hydration    Need for operative/procedural  intervention   Frequent labs Need for  IV fluids,      Level of care  SDU tele indefinitely please discontinue once patient no longer qualifies COVID-19 Labs    No results found for: SARSCOV2NAA   Precautions: admitted as  asymptomatic screening protocol   PPE: Used by the provider:   N95  eye Goggles,  Gloves     Nimesh Riolo 08/27/2021, 1:45 AM    [facility]     after 2 AM please page floor coverage PA If 7AM-7PM, please contact the day team taking care of the patient using [email or web address]   Patient was evaluated in the context of the global COVID-19 pandemic, which necessitated consideration that the patient might be at risk for infection with the SARS-CoV-2 virus that causes COVID-19. Institutional protocols and algorithms that pertain to the evaluation of patients at risk for COVID-19 are in a state of rapid  change based on information released by regulatory bodies including the CDC and federal and state organizations. These policies and algorithms were followed during the patient's care.

## 2021-08-26 NOTE — ED Triage Notes (Signed)
Pt coming from home; pt stated that she hasn't eaten in two weeks. Her CBG was WDL according to ems; VSS;

## 2021-08-26 NOTE — ED Notes (Signed)
Pt was given graham crackers and a water pt refused to eat or drink anything at this time.

## 2021-08-26 NOTE — ED Notes (Signed)
Pt noted to have two rings with her, one diamond ring and one gold band. Pt belonging put in cup with label and put in her bag

## 2021-08-26 NOTE — ED Notes (Signed)
Tried to call pt family; The brothers phone number is in the chart-no one answered.

## 2021-08-26 NOTE — ED Notes (Signed)
Pt given two warm blankets.  

## 2021-08-27 ENCOUNTER — Encounter: Payer: Self-pay | Admitting: Internal Medicine

## 2021-08-27 ENCOUNTER — Emergency Department: Payer: Self-pay

## 2021-08-27 ENCOUNTER — Inpatient Hospital Stay: Payer: Self-pay

## 2021-08-27 DIAGNOSIS — E871 Hypo-osmolality and hyponatremia: Secondary | ICD-10-CM | POA: Diagnosis present

## 2021-08-27 DIAGNOSIS — F4320 Adjustment disorder, unspecified: Secondary | ICD-10-CM

## 2021-08-27 DIAGNOSIS — F322 Major depressive disorder, single episode, severe without psychotic features: Secondary | ICD-10-CM

## 2021-08-27 DIAGNOSIS — E875 Hyperkalemia: Secondary | ICD-10-CM | POA: Diagnosis present

## 2021-08-27 DIAGNOSIS — N179 Acute kidney failure, unspecified: Secondary | ICD-10-CM | POA: Diagnosis present

## 2021-08-27 DIAGNOSIS — Z720 Tobacco use: Secondary | ICD-10-CM | POA: Diagnosis present

## 2021-08-27 DIAGNOSIS — F430 Acute stress reaction: Secondary | ICD-10-CM | POA: Diagnosis present

## 2021-08-27 DIAGNOSIS — T730XXS Starvation, sequela: Secondary | ICD-10-CM | POA: Diagnosis present

## 2021-08-27 DIAGNOSIS — R112 Nausea with vomiting, unspecified: Secondary | ICD-10-CM | POA: Diagnosis present

## 2021-08-27 DIAGNOSIS — E46 Unspecified protein-calorie malnutrition: Secondary | ICD-10-CM | POA: Diagnosis present

## 2021-08-27 DIAGNOSIS — R634 Abnormal weight loss: Secondary | ICD-10-CM | POA: Diagnosis present

## 2021-08-27 DIAGNOSIS — R319 Hematuria, unspecified: Secondary | ICD-10-CM | POA: Diagnosis present

## 2021-08-27 DIAGNOSIS — F32A Depression, unspecified: Secondary | ICD-10-CM | POA: Diagnosis present

## 2021-08-27 LAB — OSMOLALITY
Osmolality: 265 mOsm/kg — ABNORMAL LOW (ref 275–295)
Osmolality: 267 mOsm/kg — ABNORMAL LOW (ref 275–295)

## 2021-08-27 LAB — CBC WITH DIFFERENTIAL/PLATELET
Abs Immature Granulocytes: 0.28 10*3/uL — ABNORMAL HIGH (ref 0.00–0.07)
Basophils Absolute: 0 10*3/uL (ref 0.0–0.1)
Basophils Relative: 0 %
Eosinophils Absolute: 0 10*3/uL (ref 0.0–0.5)
Eosinophils Relative: 0 %
HCT: 33.9 % — ABNORMAL LOW (ref 36.0–46.0)
Hemoglobin: 12.6 g/dL (ref 12.0–15.0)
Immature Granulocytes: 2 %
Lymphocytes Relative: 19 %
Lymphs Abs: 2.3 10*3/uL (ref 0.7–4.0)
MCH: 31.9 pg (ref 26.0–34.0)
MCHC: 37.2 g/dL — ABNORMAL HIGH (ref 30.0–36.0)
MCV: 85.8 fL (ref 80.0–100.0)
Monocytes Absolute: 0.7 10*3/uL (ref 0.1–1.0)
Monocytes Relative: 6 %
Neutro Abs: 8.8 10*3/uL — ABNORMAL HIGH (ref 1.7–7.7)
Neutrophils Relative %: 73 %
Platelets: 700 10*3/uL — ABNORMAL HIGH (ref 150–400)
RBC: 3.95 MIL/uL (ref 3.87–5.11)
RDW: 11.6 % (ref 11.5–15.5)
WBC: 12.2 10*3/uL — ABNORMAL HIGH (ref 4.0–10.5)
nRBC: 0 % (ref 0.0–0.2)

## 2021-08-27 LAB — COMPREHENSIVE METABOLIC PANEL
ALT: 10 U/L (ref 0–44)
AST: 14 U/L — ABNORMAL LOW (ref 15–41)
Albumin: 3.5 g/dL (ref 3.5–5.0)
Alkaline Phosphatase: 94 U/L (ref 38–126)
Anion gap: 19 — ABNORMAL HIGH (ref 5–15)
BUN: 118 mg/dL — ABNORMAL HIGH (ref 6–20)
CO2: 15 mmol/L — ABNORMAL LOW (ref 22–32)
Calcium: 9.4 mg/dL (ref 8.9–10.3)
Chloride: 72 mmol/L — ABNORMAL LOW (ref 98–111)
Creatinine, Ser: 9.15 mg/dL — ABNORMAL HIGH (ref 0.44–1.00)
GFR, Estimated: 5 mL/min — ABNORMAL LOW (ref >60)
Glucose, Bld: 112 mg/dL — ABNORMAL HIGH (ref 70–99)
Potassium: 4.8 mmol/L (ref 3.5–5.1)
Sodium: 106 mmol/L — CL (ref 135–145)
Total Bilirubin: 0.8 mg/dL (ref 0.3–1.2)
Total Protein: 8.7 g/dL — ABNORMAL HIGH (ref 6.5–8.1)

## 2021-08-27 LAB — BASIC METABOLIC PANEL
Anion gap: 18 — ABNORMAL HIGH (ref 5–15)
Anion gap: 17 — ABNORMAL HIGH (ref 5–15)
Anion gap: 16 — ABNORMAL HIGH (ref 5–15)
Anion gap: 19 — ABNORMAL HIGH (ref 5–15)
BUN: 118 mg/dL — ABNORMAL HIGH (ref 6–20)
BUN: 121 mg/dL — ABNORMAL HIGH (ref 6–20)
BUN: 109 mg/dL — ABNORMAL HIGH (ref 6–20)
BUN: 117 mg/dL — ABNORMAL HIGH (ref 6–20)
CO2: 15 mmol/L — ABNORMAL LOW (ref 22–32)
CO2: 17 mmol/L — ABNORMAL LOW (ref 22–32)
CO2: 17 mmol/L — ABNORMAL LOW (ref 22–32)
CO2: 19 mmol/L — ABNORMAL LOW (ref 22–32)
Calcium: 9.2 mg/dL (ref 8.9–10.3)
Calcium: 9.3 mg/dL (ref 8.9–10.3)
Calcium: 8.4 mg/dL — ABNORMAL LOW (ref 8.9–10.3)
Calcium: 9.1 mg/dL (ref 8.9–10.3)
Chloride: 72 mmol/L — ABNORMAL LOW (ref 98–111)
Chloride: 70 mmol/L — ABNORMAL LOW (ref 98–111)
Chloride: 74 mmol/L — ABNORMAL LOW (ref 98–111)
Chloride: 68 mmol/L — ABNORMAL LOW (ref 98–111)
Creatinine, Ser: 9.22 mg/dL — ABNORMAL HIGH (ref 0.44–1.00)
Creatinine, Ser: 9.08 mg/dL — ABNORMAL HIGH (ref 0.44–1.00)
Creatinine, Ser: 8.02 mg/dL — ABNORMAL HIGH (ref 0.44–1.00)
Creatinine, Ser: 8.61 mg/dL — ABNORMAL HIGH (ref 0.44–1.00)
GFR, Estimated: 4 mL/min — ABNORMAL LOW (ref >60)
GFR, Estimated: 5 mL/min — ABNORMAL LOW (ref >60)
GFR, Estimated: 5 mL/min — ABNORMAL LOW (ref >60)
GFR, Estimated: 5 mL/min — ABNORMAL LOW (ref >60)
Glucose, Bld: 112 mg/dL — ABNORMAL HIGH (ref 70–99)
Glucose, Bld: 116 mg/dL — ABNORMAL HIGH (ref 70–99)
Glucose, Bld: 112 mg/dL — ABNORMAL HIGH (ref 70–99)
Glucose, Bld: 133 mg/dL — ABNORMAL HIGH (ref 70–99)
Potassium: 5.5 mmol/L — ABNORMAL HIGH (ref 3.5–5.1)
Potassium: 5.3 mmol/L — ABNORMAL HIGH (ref 3.5–5.1)
Potassium: 4.4 mmol/L (ref 3.5–5.1)
Potassium: 3.6 mmol/L (ref 3.5–5.1)
Sodium: 105 mmol/L — CL (ref 135–145)
Sodium: 104 mmol/L — CL (ref 135–145)
Sodium: 107 mmol/L — CL (ref 135–145)
Sodium: 106 mmol/L — CL (ref 135–145)

## 2021-08-27 LAB — URINALYSIS, ROUTINE W REFLEX MICROSCOPIC
Bacteria, UA: NONE SEEN
Bilirubin Urine: NEGATIVE
Glucose, UA: NEGATIVE mg/dL
Nitrite: NEGATIVE
Protein, ur: >300 mg/dL — AB
RBC / HPF: >50 RBC/hpf — ABNORMAL HIGH (ref 0–5)
Specific Gravity, Urine: 1.02 (ref 1.005–1.030)
Squamous Epithelial / HPF: NONE SEEN (ref 0–5)
WBC, UA: >50 WBC/hpf — ABNORMAL HIGH (ref 0–5)
pH: 6 (ref 5.0–8.0)

## 2021-08-27 LAB — RETICULOCYTES
Immature Retic Fract: 6.2 % (ref 2.3–15.9)
RBC.: 3.77 MIL/uL — ABNORMAL LOW (ref 3.87–5.11)
Retic Count, Absolute: 43 10*3/uL (ref 19.0–186.0)
Retic Ct Pct: 1.1 % (ref 0.4–3.1)

## 2021-08-27 LAB — C-REACTIVE PROTEIN: CRP: 4.3 mg/dL — ABNORMAL HIGH (ref <1.0)

## 2021-08-27 LAB — MAGNESIUM
Magnesium: 2.1 mg/dL (ref 1.7–2.4)
Magnesium: 2 mg/dL (ref 1.7–2.4)

## 2021-08-27 LAB — RESP PANEL BY RT-PCR (FLU A&B, COVID) ARPGX2
Influenza A by PCR: NEGATIVE
Influenza B by PCR: NEGATIVE
SARS Coronavirus 2 by RT PCR: NEGATIVE

## 2021-08-27 LAB — IRON AND TIBC
Iron: 129 ug/dL (ref 28–170)
Saturation Ratios: 51 % — ABNORMAL HIGH (ref 10.4–31.8)
TIBC: 255 ug/dL (ref 250–450)
UIBC: 126 ug/dL

## 2021-08-27 LAB — PHOSPHORUS
Phosphorus: 7.2 mg/dL — ABNORMAL HIGH (ref 2.5–4.6)
Phosphorus: 6.8 mg/dL — ABNORMAL HIGH (ref 2.5–4.6)

## 2021-08-27 LAB — OSMOLALITY, URINE: Osmolality, Ur: 291 mOsm/kg — ABNORMAL LOW (ref 300–900)

## 2021-08-27 LAB — TSH
TSH: 0.72 u[IU]/mL (ref 0.350–4.500)
TSH: 0.53 u[IU]/mL (ref 0.350–4.500)
TSH: 0.475 u[IU]/mL (ref 0.350–4.500)

## 2021-08-27 LAB — SODIUM, URINE, RANDOM: Sodium, Ur: 63 mmol/L

## 2021-08-27 LAB — URINE DRUG SCREEN, QUALITATIVE (ARMC ONLY)
Amphetamines, Ur Screen: NOT DETECTED
Barbiturates, Ur Screen: NOT DETECTED
Benzodiazepine, Ur Scrn: NOT DETECTED
Cannabinoid 50 Ng, Ur ~~LOC~~: NOT DETECTED
Cocaine Metabolite,Ur ~~LOC~~: NOT DETECTED
MDMA (Ecstasy)Ur Screen: NOT DETECTED
Methadone Scn, Ur: NOT DETECTED
Opiate, Ur Screen: NOT DETECTED
Phencyclidine (PCP) Ur S: NOT DETECTED
Tricyclic, Ur Screen: NOT DETECTED

## 2021-08-27 LAB — SEDIMENTATION RATE: Sed Rate: 66 mm/hr — ABNORMAL HIGH (ref 0–30)

## 2021-08-27 LAB — FOLATE: Folate: 10.5 ng/mL (ref >5.9)

## 2021-08-27 LAB — HIV ANTIBODY (ROUTINE TESTING W REFLEX): HIV Screen 4th Generation wRfx: NONREACTIVE

## 2021-08-27 LAB — PROTIME-INR
INR: 1 (ref 0.8–1.2)
Prothrombin Time: 13.5 seconds (ref 11.4–15.2)

## 2021-08-27 LAB — LACTIC ACID, PLASMA: Lactic Acid, Venous: 0.9 mmol/L (ref 0.5–1.9)

## 2021-08-27 LAB — PREALBUMIN: Prealbumin: 21.5 mg/dL (ref 18–38)

## 2021-08-27 LAB — CK: Total CK: 276 U/L — ABNORMAL HIGH (ref 38–234)

## 2021-08-27 LAB — VITAMIN B12: Vitamin B-12: 161 pg/mL — ABNORMAL LOW (ref 180–914)

## 2021-08-27 LAB — FERRITIN: Ferritin: 461 ng/mL — ABNORMAL HIGH (ref 11–307)

## 2021-08-27 MED ORDER — ACETAMINOPHEN 650 MG RE SUPP: 650.0000 mg | Freq: Four times a day (QID) | RECTAL | Status: DC | PRN

## 2021-08-27 MED ORDER — TROLAMINE SALICYLATE 10 % EX CREA: TOPICAL_CREAM | Freq: Two times a day (BID) | CUTANEOUS | Status: DC | PRN

## 2021-08-27 MED ORDER — ESCITALOPRAM OXALATE 10 MG PO TABS
5.0000 mg | ORAL_TABLET | Freq: Every day | ORAL | Status: DC
  Administered 2021-08-27 – 2021-09-01 (×6): 5 mg via ORAL
  Filled 2021-08-27: qty 1
  Filled 2021-08-27: qty 0.5
  Filled 2021-08-27: qty 1
  Filled 2021-08-27 (×2): qty 0.5
  Filled 2021-08-27: qty 1

## 2021-08-27 MED ORDER — HEPARIN SODIUM (PORCINE) 5000 UNIT/ML IJ SOLN
5000.0000 [IU] | Freq: Three times a day (TID) | INTRAMUSCULAR | Status: DC
  Administered 2021-08-27 – 2021-09-01 (×14): 5000 [IU] via SUBCUTANEOUS
  Filled 2021-08-27 (×14): qty 1

## 2021-08-27 MED ORDER — ACETAMINOPHEN 325 MG PO TABS
650.0000 mg | ORAL_TABLET | Freq: Four times a day (QID) | ORAL | Status: DC | PRN
  Administered 2021-08-28 – 2021-09-01 (×3): 650 mg via ORAL
  Filled 2021-08-27 (×3): qty 2

## 2021-08-27 MED ORDER — METOCLOPRAMIDE HCL 5 MG/ML IJ SOLN: 5.0000 mg | Freq: Four times a day (QID) | INTRAMUSCULAR | Status: DC | PRN

## 2021-08-27 MED ORDER — MUSCLE RUB 10-15 % EX CREA
TOPICAL_CREAM | CUTANEOUS | Status: DC | PRN
  Filled 2021-08-27: qty 85

## 2021-08-27 MED ORDER — NICOTINE 7 MG/24HR TD PT24
7.0000 mg | MEDICATED_PATCH | Freq: Every day | TRANSDERMAL | Status: DC
  Administered 2021-08-29: 7 mg via TRANSDERMAL
  Filled 2021-08-27 (×6): qty 1

## 2021-08-27 NOTE — ED Notes (Signed)
IVC medical admit 

## 2021-08-27 NOTE — Evaluation (Signed)
Physical Therapy Evaluation Patient Details Name: Karen Mooney MRN: [id number] DOB: 17-May-1961 Today's Date: 08/27/2021   History of Present Illness  60 y.o. female with medical history significant of recurrent UTIs, tobacco abuse , weight loss.  Family field for involuntary commitment as she apparently has been hardly eating or drinking recently and generalized weakness.  Was seen at [facility] on 27 August for presumed UTI.  Clinical Impression  Pt's family filed for involuntary commitment, apparently she has been eating and drinking little in the last few weeks.  Prior to session, provider left room stating that pt was asking to get up to the bathroom; given her prior level of function PT did go in to initiate eval and assist with toileting.  Pt with critically low Na+ levels, spoke with nursing about getting pt up and cleared for activity.  HR did increase to the 130s with minimal activity to commode, deferred further mobility/PT - nursing notified of vitals response.  Per medical and psych progress/work up pt likely will be able to return home from a PT stand-point with expected improvement.    Follow Up Recommendations Home health PT (per progress, medical)    Equipment Recommendations  Rolling walker with 5" wheels;None recommended by PT    Recommendations for Other Services       Precautions / Restrictions Precautions Precautions: Fall Restrictions Weight Bearing Restrictions: No      Mobility  Bed Mobility Overal bed mobility: Modified Independent             General bed mobility comments: Pt able to get to sitting EOB and don socks w/o assist    Transfers Overall transfer level: Needs assistance Equipment used: Rolling walker (2 wheeled) Transfers: Sit to/from Stand Sit to Stand: Min guard         General transfer comment: Pt did not need phyiscal assist to attain standing, showed minimal initial unsteadiness but no overt safety issues when appropriately using  RW.  Ambulation/Gait Ambulation/Gait assistance: Min guard Gait Distance (Feet): 25 Feet Assistive device: Rolling walker (2 wheeled)       General Gait Details: Minimal in-room ambulation w/ RW.  Pt's HR did climb to 130s with the effort, she reported some minimal fatigue and likely would have struggled to get anywhere near her baseline mobility.  She did not have any LOBs but did seem to rely on RW, which apparently she does not need an AD at baseline.  Stairs            Wheelchair Mobility    Modified Rankin (Stroke Patients Only)       Balance Overall balance assessment: Needs assistance   Sitting balance-Leahy Scale: Good     Standing balance support: Bilateral upper extremity supported Standing balance-Leahy Scale: Fair Standing balance comment: Pt needing UEs on walker this date, which is not her baseline                             Pertinent Vitals/Pain Pain Assessment: No/denies pain    Home Living Family/patient expects to be discharged to:: Private residence Living Arrangements: Alone Available Help at Discharge: Family (brother lives locally and apparently can and does stop in, help with errands, etc)   Home Access: Level entry     Home Layout: One level Home Equipment:  (pt not overly forthwith about answering questions but indicates she has but does not use RW)      Prior Function Level  of Independence: Independent         Comments: Pt indicates that she typically runs her own errands and is able to manage w/o assist     Hand Dominance        Extremity/Trunk Assessment   Upper Extremity Assessment Upper Extremity Assessment: Generalized weakness;Overall [facility] for tasks assessed    Lower Extremity Assessment Lower Extremity Assessment: Generalized weakness;Overall WFL for tasks assessed       Communication   Communication: No difficulties  Cognition Arousal/Alertness: Lethargic Behavior During Therapy: WFL for tasks  assessed/performed Overall Cognitive Status: Within Functional Limits for tasks assessed                                 General Comments: Pt needing occasional reinforcement to insure follow through/staying on task but able to answer questions/interact appropriately t/o the session      General Comments General comments (skin integrity, edema, etc.): Pt was able to perform bed mobility w/o assist, stood/ambulated relatively well but with RW, which is not baseline.  Pt's HR did climb to 130s with light, in room, activity.    Exercises     Assessment/Plan    PT Assessment Patient needs continued PT services  PT Problem List Decreased strength;Decreased range of motion;Decreased activity tolerance;Decreased mobility;Decreased balance;Decreased coordination;Decreased cognition;Decreased knowledge of use of DME;Decreased safety awareness;Cardiopulmonary status limiting activity       PT Treatment Interventions DME instruction;Gait training;Functional mobility training;Therapeutic activities;Therapeutic exercise;Balance training;Neuromuscular re-education;Patient/family education    PT Goals (Current goals can be found in the Care Plan section)  Acute Rehab PT Goals Patient Stated Goal: Go home PT Goal Formulation: With patient Time For Goal Achievement: 09/10/21 Potential to Achieve Goals: Fair    Frequency Min 2X/week   Barriers to discharge        Co-evaluation               AM-PAC PT "6 Clicks" Mobility  Outcome Measure Help needed turning from your back to your side while in a flat bed without using bedrails?: None Help needed moving from lying on your back to sitting on the side of a flat bed without using bedrails?: None Help needed moving to and from a bed to a chair (including a wheelchair)?: A Little Help needed standing up from a chair using your arms (e.g., wheelchair or bedside chair)?: A Little Help needed to walk in hospital room?: A  Little Help needed climbing 3-5 steps with a railing? : A Little 6 Click Score: 20    End of Session Equipment Utilized During Treatment: Gait belt Activity Tolerance: Patient limited by fatigue;Patient tolerated treatment well Patient left: with call bell/phone within reach;in bed Nurse Communication: Mobility status PT Visit Diagnosis: Muscle weakness (generalized) (M62.81);Unsteadiness on feet (R26.81);Difficulty in walking, not elsewhere classified (R26.2)    Time: 7412-8786 PT Time Calculation (min) (ACUTE ONLY): 26 min   Charges:   PT Evaluation $PT Eval Low Complexity: 1 Low PT Treatments $Gait Training: 8-22 mins        Malachi Pro, DPT 08/27/2021, 1:10 PM

## 2021-08-27 NOTE — ED Notes (Signed)
Pt lying in bed asleep, pt vitals stable at this time. Pt son Windy Fast left and would like to be called when pt gets into hospital room. [phone]

## 2021-08-27 NOTE — ED Notes (Signed)
Pt report given to Jenna RN

## 2021-08-27 NOTE — Consult Note (Addendum)
[facility] Face-to-Face Psychiatry Consult   Reason for Consult:  not eating, depressed mood Referring Physician:  EDP Patient Identification: Karen Mooney MRN:  [id number] Principal Diagnosis: <principal problem not specified> Diagnosis:  Active Problems:   AKI (acute kidney injury) (HCC)   Nausea & vomiting   Stress reaction, acute   Hyponatremia   Hematuria   Weight loss   Malnutrition due to starvation (HCC)   Hyperkalemia   Tobacco abuse   Total Time spent with patient: 45 minutes  Subjective:   Karen Mooney is a 60 y.o. female patient admitted with kidney issues, malnutrition, and malnourishment.  HPI:  60 yo female presented to the ED after not eating for two weeks.  She reports this is because "It just want stay down", vomiting for 2 weeks per client.  Denies depression, anxiety, or stressors along with suicidal/homicidal ideations, hallucinations, or substance abuse.  When asked about ADLs, she states, "I get up when I want to."  Support system of brothers and sisters.  Poor historian with minimal engagement in assessment.  Appears depressed, despondent, and slow moving.  Psych will continue to follow on the medical floor as she stabilizes medically.  Past Psychiatric History: denies  Risk to Self:  self-care neglect Risk to Others:  none Prior Inpatient Therapy:  denies Prior Outpatient Therapy:  denies  Past Medical History: History reviewed. No pertinent past medical history.  Past Surgical History:  Procedure Laterality Date   ECTOPIC PREGNANCY SURGERY     Family History:  Family History  Problem Relation Age of Onset   CAD Neg Hx    Diabetes Neg Hx    Family Psychiatric  History: denies Social History:  Social History   Substance and Sexual Activity  Alcohol Use Yes     Social History   Substance and Sexual Activity  Drug Use Never    Social History   Socioeconomic History   Marital status: Single    Spouse name: Not on file   Number of  children: Not on file   Years of education: Not on file   Highest education level: Not on file  Occupational History   Not on file  Tobacco Use   Smoking status: Every Day    Packs/day: 1.00    Types: Cigarettes   Smokeless tobacco: Never  Substance and Sexual Activity   Alcohol use: Yes   Drug use: Never   Sexual activity: Not on file  Other Topics Concern   Not on file  Social History Narrative   Not on file   Social Determinants of Health   Financial Resource Strain: Not on file  Food Insecurity: Not on file  Transportation Needs: Not on file  Physical Activity: Not on file  Stress: Not on file  Social Connections: Not on file   Additional Social History:    Allergies:  No Known Allergies  Labs:  Results for orders placed or performed during the hospital encounter of 08/26/21 (from the past 48 hour(s))  CBC with Differential     Status: Abnormal   Collection Time: 08/26/21 10:31 PM  Result Value Ref Range   WBC 12.3 (H) 4.0 - 10.5 K/uL   RBC 4.14 3.87 - 5.11 MIL/uL   Hemoglobin 13.7 12.0 - 15.0 g/dL   HCT 45.3 64.6 - 80.3 %   MCV 89.4 80.0 - 100.0 fL   MCH 33.1 26.0 - 34.0 pg   MCHC 37.0 (H) 30.0 - 36.0 g/dL   RDW 21.2 24.8 -  15.5 %   Platelets 704 (H) 150 - 400 K/uL   nRBC 0.0 0.0 - 0.2 %   Neutrophils Relative % 81 %   Neutro Abs 10.0 (H) 1.7 - 7.7 K/uL   Lymphocytes Relative 11 %   Lymphs Abs 1.3 0.7 - 4.0 K/uL   Monocytes Relative 5 %   Monocytes Absolute 0.6 0.1 - 1.0 K/uL   Eosinophils Relative 0 %   Eosinophils Absolute 0.0 0.0 - 0.5 K/uL   Basophils Relative 0 %   Basophils Absolute 0.0 0.0 - 0.1 K/uL   Immature Granulocytes 3 %   Abs Immature Granulocytes 0.35 (H) 0.00 - 0.07 K/uL    Comment: Performed at [facility], [address] [place], [place] [address]  Comprehensive metabolic panel     Status: Abnormal   Collection Time: 08/26/21 10:31 PM  Result Value Ref Range   Sodium 103 (LL) 135 - 145 mmol/L    Comment: CRITICAL  RESULT CALLED TO, READ BACK BY AND VERIFIED WITH: KATIE ALLRED AT 2318 08/26/21 DAS    Potassium 5.6 (H) 3.5 - 5.1 mmol/L   Chloride 67 (L) 98 - 111 mmol/L   CO2 16 (L) 22 - 32 mmol/L   Glucose, Bld 119 (H) 70 - 99 mg/dL    Comment: Glucose reference range applies only to samples taken after fasting for at least 8 hours.   BUN 127 (H) 6 - 20 mg/dL    Comment: RESULTS CONFIRMED BY MANUAL DILUTION   Creatinine, Ser 9.46 (H) 0.44 - 1.00 mg/dL   Calcium 9.6 8.9 - 29.5 mg/dL   Total Protein 9.5 (H) 6.5 - 8.1 g/dL   Albumin 3.9 3.5 - 5.0 g/dL   AST 14 (L) 15 - 41 U/L   ALT 8 0 - 44 U/L   Alkaline Phosphatase 108 38 - 126 U/L   Total Bilirubin 0.8 0.3 - 1.2 mg/dL   GFR, Estimated 4 (L) >60 mL/min    Comment: (NOTE) Calculated using the CKD-EPI Creatinine Equation (2021)    Anion gap 20 (H) 5 - 15    Comment: Performed at [facility], [address] [place], [place] [address]  Ethanol     Status: None   Collection Time: 08/26/21 10:31 PM  Result Value Ref Range   Alcohol, Ethyl (B) <10 <10 mg/dL    Comment: (NOTE) Lowest detectable limit for serum alcohol is 10 mg/dL.  For medical purposes only. Performed at [facility], [address] [place], [place] [address]   Lipase, blood     Status: None   Collection Time: 08/26/21 10:31 PM  Result Value Ref Range   Lipase 33 11 - 51 U/L    Comment: Performed at [facility], [address] [place], [place] [address]  TSH     Status: None   Collection Time: 08/26/21 11:02 PM  Result Value Ref Range   TSH 0.720 0.350 - 4.500 uIU/mL    Comment: Performed by a 3rd Generation assay with a functional sensitivity of <=0.01 uIU/mL. Performed at [facility], [address], [place], [place] [address]   Magnesium     Status: None   Collection Time: 08/26/21 11:02 PM  Result Value Ref Range   Magnesium 2.1 1.7 - 2.4 mg/dL    Comment: Performed at [facility], [address]  [place], [place] [address]  Osmolality     Status: Abnormal   Collection Time: 08/26/21 11:12 PM  Result Value Ref  Range   Osmolality 265 (L) 275 - 295 mOsm/kg    Comment: Performed at [facility], [address] [place], [place] [address]  Basic metabolic panel     Status: Abnormal   Collection Time: 08/26/21 11:21 PM  Result Value Ref Range   Sodium 105 (LL) 135 - 145 mmol/L    Comment: CRITICAL RESULT CALLED TO, READ BACK BY AND VERIFIED WITH KATIE ALLRED AT 0021 08/27/21 DAS    Potassium 5.5 (H) 3.5 - 5.1 mmol/L   Chloride 72 (L) 98 - 111 mmol/L   CO2 15 (L) 22 - 32 mmol/L   Glucose, Bld 112 (H) 70 - 99 mg/dL    Comment: Glucose reference range applies only to samples taken after fasting for at least 8 hours.   BUN 118 (H) 6 - 20 mg/dL    Comment: RESULT CONFIRMED BY MANUAL DILUTION DAS   Creatinine, Ser 9.22 (H) 0.44 - 1.00 mg/dL   Calcium 9.2 8.9 - 47.8 mg/dL   GFR, Estimated 4 (L) >60 mL/min    Comment: (NOTE) Calculated using the CKD-EPI Creatinine Equation (2021)    Anion gap 18 (H) 5 - 15    Comment: Performed at [facility], [address] [place], [place] [address]  Blood gas, venous     Status: Abnormal (Preliminary result)   Collection Time: 08/26/21 11:58 PM  Result Value Ref Range   FIO2 PENDING    pH, Ven 7.34 7.250 - 7.430   pCO2, Ven 27 (L) 44.0 - 60.0 mmHg   pO2, Ven 45.0 32.0 - 45.0 mmHg   Bicarbonate 14.6 (L) 20.0 - 28.0 mmol/L   Acid-base deficit 9.6 (H) 0.0 - 2.0 mmol/L   O2 Saturation 77.5 %   Patient temperature 37.0    Collection site VENOUS    Sample type VENOUS     Comment: Performed at [facility], [address] [place], [place] [address]   Mechanical Rate PENDING   Urinalysis, Routine w reflex microscopic     Status: Abnormal   Collection Time: 08/27/21 12:06 AM  Result Value Ref Range   Color, Urine YELLOW YELLOW   APPearance CLOUDY (A) CLEAR   Specific Gravity, Urine 1.020 1.005 - 1.030   pH 6.0 5.0 - 8.0    Glucose, UA NEGATIVE NEGATIVE mg/dL   Hgb urine dipstick LARGE (A) NEGATIVE   Bilirubin Urine NEGATIVE NEGATIVE   Ketones, ur TRACE (A) NEGATIVE mg/dL   Protein, ur >578 (A) NEGATIVE mg/dL   Nitrite NEGATIVE NEGATIVE   Leukocytes,Ua LARGE (A) NEGATIVE   RBC / HPF >50 (H) 0 - 5 RBC/hpf   WBC, UA >50 (H) 0 - 5 WBC/hpf   Bacteria, UA NONE SEEN NONE SEEN   Squamous Epithelial / LPF NONE SEEN 0 - 5   WBC Clumps PRESENT    Budding Yeast PRESENT     Comment: Performed at [facility], [address] [place], [place] [address]  Osmolality, urine     Status: Abnormal   Collection Time: 08/27/21 12:06 AM  Result Value Ref Range   Osmolality, Ur 291 (L) 300 - 900 mOsm/kg    Comment: Performed at [facility], [address] [place], [place] [address]  Sodium, urine, random     Status: None   Collection Time: 08/27/21 12:06 AM  Result Value Ref Range   Sodium, Ur 63 mmol/L    Comment: Performed at [facility], [address] [place], [place] [address]  Protime-INR  Status: None   Collection Time: 08/27/21 12:06 AM  Result Value Ref Range   Prothrombin Time 13.5 11.4 - 15.2 seconds   INR 1.0 0.8 - 1.2    Comment: (NOTE) INR goal varies based on device and disease states. Performed at [facility], [address] [place], [place] [address]   Lactic acid, plasma     Status: None   Collection Time: 08/27/21 12:06 AM  Result Value Ref Range   Lactic Acid, Venous 0.9 0.5 - 1.9 mmol/L    Comment: Performed at [facility], [address], [place], [place] [address]  Urine Drug Screen, Qualitative ([facility] only)     Status: None   Collection Time: 08/27/21 12:06 AM  Result Value Ref Range   Tricyclic, Ur Screen NONE DETECTED NONE DETECTED   Amphetamines, Ur Screen NONE DETECTED NONE DETECTED   MDMA (Ecstasy)Ur Screen NONE DETECTED NONE DETECTED   Cocaine Metabolite,Ur [place] NONE DETECTED NONE DETECTED   Opiate, Ur Screen NONE DETECTED  NONE DETECTED   Phencyclidine (PCP) Ur S NONE DETECTED NONE DETECTED   Cannabinoid 50 Ng, Ur [place] NONE DETECTED NONE DETECTED   Barbiturates, Ur Screen NONE DETECTED NONE DETECTED   Benzodiazepine, Ur Scrn NONE DETECTED NONE DETECTED   Methadone Scn, Ur NONE DETECTED NONE DETECTED    Comment: (NOTE) Tricyclics + metabolites, urine    Cutoff 1000 ng/mL Amphetamines + metabolites, urine  Cutoff 1000 ng/mL MDMA (Ecstasy), urine              Cutoff 500 ng/mL Cocaine Metabolite, urine          Cutoff 300 ng/mL Opiate + metabolites, urine        Cutoff 300 ng/mL Phencyclidine (PCP), urine         Cutoff 25 ng/mL Cannabinoid, urine                 Cutoff 50 ng/mL Barbiturates + metabolites, urine  Cutoff 200 ng/mL Benzodiazepine, urine              Cutoff 200 ng/mL Methadone, urine                   Cutoff 300 ng/mL  The urine drug screen provides only a preliminary, unconfirmed analytical test result and should not be used for non-medical purposes. Clinical consideration and professional judgment should be applied to any positive drug screen result due to possible interfering substances. A more specific alternate chemical method must be used in order to obtain a confirmed analytical result. Gas chromatography / mass spectrometry (GC/MS) is the preferred confirm atory method. Performed at [facility], [address] [place], [place] [address]   Sedimentation rate     Status: Abnormal   Collection Time: 08/27/21  1:08 AM  Result Value Ref Range   Sed Rate 66 (H) 0 - 30 mm/hr    Comment: Performed at [facility], [address], [place], [place] [address]  Basic metabolic panel     Status: Abnormal   Collection Time: 08/27/21  1:47 AM  Result Value Ref Range   Sodium 104 (LL) 135 - 145 mmol/L    Comment: CRITICAL RESULT CALLED TO, READ BACK BY AND VERIFIED WITH KATIE ALLRED RN AT 0254 ON 08/27/21 SNG    Potassium 5.3 (H) 3.5 - 5.1 mmol/L   Chloride 70 (L) 98 - 111  mmol/L   CO2 17 (L) 22 - 32 mmol/L   Glucose, Bld 116 (H) 70 -  99 mg/dL    Comment: Glucose reference range applies only to samples taken after fasting for at least 8 hours.   BUN 121 (H) 6 - 20 mg/dL    Comment: RESULT CONFIRMED BY MANUAL DILUTION SNG   Creatinine, Ser 9.08 (H) 0.44 - 1.00 mg/dL   Calcium 9.3 8.9 - 35.3 mg/dL   GFR, Estimated 5 (L) >60 mL/min    Comment: (NOTE) Calculated using the CKD-EPI Creatinine Equation (2021)    Anion gap 17 (H) 5 - 15    Comment: Performed at [facility], [address] [place], [place] [address]  Phosphorus     Status: Abnormal   Collection Time: 08/27/21  1:47 AM  Result Value Ref Range   Phosphorus 7.2 (H) 2.5 - 4.6 mg/dL    Comment: Performed at [facility], [address] [place], [place] [address]  CK     Status: Abnormal   Collection Time: 08/27/21  1:47 AM  Result Value Ref Range   Total CK 276 (H) 38 - 234 U/L    Comment: Performed at [facility], [address] [place], [place] [address]  TSH     Status: None   Collection Time: 08/27/21  1:47 AM  Result Value Ref Range   TSH 0.530 0.350 - 4.500 uIU/mL    Comment: Performed by a 3rd Generation assay with a functional sensitivity of <=0.01 uIU/mL. Performed at [facility], [address] [place], [place] [address]   Osmolality     Status: Abnormal   Collection Time: 08/27/21  1:47 AM  Result Value Ref Range   Osmolality 267 (L) 275 - 295 mOsm/kg    Comment: Performed at [facility], [address] [place], [place] [address]  C-reactive protein     Status: Abnormal   Collection Time: 08/27/21  1:47 AM  Result Value Ref Range   CRP 4.3 (H) <1.0 mg/dL    Comment: Performed at [facility], [address], [place], [place] [address]  Resp Panel by RT-PCR (Flu A&B, Covid) Nasopharyngeal Swab     Status: None   Collection Time: 08/27/21  4:21 AM   Specimen: Nasopharyngeal Swab; Nasopharyngeal(NP) swabs in vial  transport medium  Result Value Ref Range   SARS Coronavirus 2 by RT PCR NEGATIVE NEGATIVE    Comment: (NOTE) SARS-CoV-2 target nucleic acids are NOT DETECTED.  The SARS-CoV-2 RNA is generally detectable in upper respiratory specimens during the acute phase of infection. The lowest concentration of SARS-CoV-2 viral copies this assay can detect is 138 copies/mL. A negative result does not preclude SARS-Cov-2 infection and should not be used as the sole basis for treatment or other patient management decisions. A negative result may occur with  improper specimen collection/handling, submission of specimen other than nasopharyngeal swab, presence of viral mutation(s) within the areas targeted by this assay, and inadequate number of viral copies(<138 copies/mL). A negative result must be combined with clinical observations, patient history, and epidemiological information. The expected result is Negative.  Fact Sheet for Patients:  BloggerCourse.com  Fact Sheet for Healthcare Providers:  SeriousBroker.it  This test is no t yet approved or cleared by the [place] FDA and  has been authorized for detection and/or diagnosis of SARS-CoV-2 by FDA under an Emergency Use Authorization (EUA). This EUA will remain  in effect (meaning this test can be used) for the duration of the COVID-19 declaration under Section 564(b)(1) of the Act, 21 U.S.C.section 360bbb-3(b)(1), unless the  authorization is terminated  or revoked sooner.       Influenza A by PCR NEGATIVE NEGATIVE   Influenza B by PCR NEGATIVE NEGATIVE    Comment: (NOTE) The Xpert Xpress SARS-CoV-2/FLU/RSV plus assay is intended as an aid in the diagnosis of influenza from Nasopharyngeal swab specimens and should not be used as a sole basis for treatment. Nasal washings and aspirates are unacceptable for Xpert Xpress SARS-CoV-2/FLU/RSV testing.  Fact Sheet for  Patients: BloggerCourse.com  Fact Sheet for Healthcare Providers: SeriousBroker.it  This test is not yet approved or cleared by the [place] FDA and has been authorized for detection and/or diagnosis of SARS-CoV-2 by FDA under an Emergency Use Authorization (EUA). This EUA will remain in effect (meaning this test can be used) for the duration of the COVID-19 declaration under Section 564(b)(1) of the Act, 21 U.S.C. section 360bbb-3(b)(1), unless the authorization is terminated or revoked.  Performed at [facility], [address] [place], [place] [address]   Prealbumin     Status: None   Collection Time: 08/27/21  4:21 AM  Result Value Ref Range   Prealbumin 21.5 18 - 38 mg/dL    Comment: Performed at [facility], [address] [place], [place] [address]  Magnesium     Status: None   Collection Time: 08/27/21  4:21 AM  Result Value Ref Range   Magnesium 2.0 1.7 - 2.4 mg/dL    Comment: Performed at [facility], [address] [place], [place] [address]  Phosphorus     Status: Abnormal   Collection Time: 08/27/21  4:21 AM  Result Value Ref Range   Phosphorus 6.8 (H) 2.5 - 4.6 mg/dL    Comment: Performed at [facility], [address] [place], [place] [address]  CBC WITH DIFFERENTIAL     Status: Abnormal   Collection Time: 08/27/21  4:21 AM  Result Value Ref Range   WBC 12.2 (H) 4.0 - 10.5 K/uL   RBC 3.95 3.87 - 5.11 MIL/uL   Hemoglobin 12.6 12.0 - 15.0 g/dL   HCT 65.7 (L) 84.6 - 96.2 %   MCV 85.8 80.0 - 100.0 fL   MCH 31.9 26.0 - 34.0 pg   MCHC 37.2 (H) 30.0 - 36.0 g/dL   RDW 95.2 84.1 - 32.4 %   Platelets 700 (H) 150 - 400 K/uL   nRBC 0.0 0.0 - 0.2 %   Neutrophils Relative % 73 %   Neutro Abs 8.8 (H) 1.7 - 7.7 K/uL   Lymphocytes Relative 19 %   Lymphs Abs 2.3 0.7 - 4.0 K/uL   Monocytes Relative 6 %   Monocytes Absolute 0.7 0.1 - 1.0 K/uL   Eosinophils Relative 0 %    Eosinophils Absolute 0.0 0.0 - 0.5 K/uL   Basophils Relative 0 %   Basophils Absolute 0.0 0.0 - 0.1 K/uL   Immature Granulocytes 2 %   Abs Immature Granulocytes 0.28 (H) 0.00 - 0.07 K/uL    Comment: Performed at [facility], [address] [place], [place] [address]  TSH     Status: None   Collection Time: 08/27/21  4:21 AM  Result Value Ref Range   TSH 0.475 0.350 - 4.500 uIU/mL    Comment: Performed by a 3rd Generation assay with a functional sensitivity of <=0.01 uIU/mL. Performed at [facility], [address] [place], [place] [address]   Comprehensive metabolic panel     Status: Abnormal   Collection Time: 08/27/21  4:21 AM  Result Value Ref Range   Sodium 106 (LL) 135 - 145 mmol/L    Comment: CRITICAL RESULT CALLED TO, READ BACK BY AND VERIFIED WITH JENNA ELLINGTON RN  08/27/21 SCS    Potassium 4.8 3.5 - 5.1 mmol/L   Chloride 72 (L) 98 - 111 mmol/L   CO2 15 (L) 22 - 32 mmol/L   Glucose, Bld 112 (H) 70 - 99 mg/dL    Comment: Glucose reference range applies only to samples taken after fasting for at least 8 hours.   BUN 118 (H) 6 - 20 mg/dL    Comment: RESULT CONFIRMED BY MANUAL DILUTION SCS   Creatinine, Ser 9.15 (H) 0.44 - 1.00 mg/dL   Calcium 9.4 8.9 - 16.1 mg/dL   Total Protein 8.7 (H) 6.5 - 8.1 g/dL   Albumin 3.5 3.5 - 5.0 g/dL   AST 14 (L) 15 - 41 U/L   ALT 10 0 - 44 U/L   Alkaline Phosphatase 94 38 - 126 U/L   Total Bilirubin 0.8 0.3 - 1.2 mg/dL   GFR, Estimated 5 (L) >60 mL/min    Comment: (NOTE) Calculated using the CKD-EPI Creatinine Equation (2021)    Anion gap 19 (H) 5 - 15    Comment: Performed at [facility], [address] [place], [place] [address]  Vitamin B12     Status: Abnormal   Collection Time: 08/27/21  4:21 AM  Result Value Ref Range   Vitamin B-12 161 (L) 180 - 914 pg/mL    Comment: (NOTE) This assay is not validated for testing neonatal or myeloproliferative syndrome specimens for Vitamin B12  levels. Performed at [facility], [address], [place], [place] [address]   Folate     Status: None   Collection Time: 08/27/21  4:21 AM  Result Value Ref Range   Folate 10.5 >5.9 ng/mL    Comment: Performed at [facility], [address] [place], [place] [address]  Iron and TIBC     Status: Abnormal   Collection Time: 08/27/21  4:21 AM  Result Value Ref Range   Iron 129 28 - 170 ug/dL   TIBC 782 956 - 213 ug/dL   Saturation Ratios 51 (H) 10.4 - 31.8 %   UIBC 126 ug/dL    Comment: Performed at [facility], [address] [place], [place] [address]  Ferritin     Status: Abnormal   Collection Time: 08/27/21  4:21 AM  Result Value Ref Range   Ferritin 461 (H) 11 - 307 ng/mL    Comment: Performed at [facility], [address] [place], [place] [address]  Reticulocytes     Status: Abnormal   Collection Time: 08/27/21  4:21 AM  Result Value Ref Range   Retic Ct Pct 1.1 0.4 - 3.1 %   RBC. 3.77 (L) 3.87 - 5.11 MIL/uL   Retic Count, Absolute 43.0 19.0 - 186.0 K/uL   Immature Retic Fract 6.2 2.3 - 15.9 %    Comment: Performed at [facility], [address] [place], [place] [address]    Current Facility-Administered Medications  Medication Dose Route Frequency Provider Last Rate Last Admin   acetaminophen (TYLENOL) tablet 650 mg  650 mg Oral Q6H PRN Therisa Doyne, MD       Or   acetaminophen (TYLENOL) suppository 650 mg  650 mg Rectal Q6H PRN Doutova, Anastassia, MD       metoCLOPramide (REGLAN) injection 5 mg  5 mg  Intravenous Q6H PRN Therisa Doyneoutova, Anastassia, MD       Muscle Rub CREA   Topical PRN Opyd, Lavone Neriimothy S, MD       nicotine (NICODERM CQ - dosed in mg/24 hr) patch 7 mg  7 mg Transdermal Daily Doutova, Anastassia, MD       sodium bicarbonate 150 mEq in dextrose 5 % 1,150 mL infusion   Intravenous Continuous Doutova, Anastassia, MD 60 mL/hr at 08/27/21 0000 New Bag at 08/27/21 0000   thiamine 500mg  in normal saline (50ml)  IVPB  500 mg Intravenous NOW Therisa Doyneoutova, Anastassia, MD   Held at 08/27/21 0229   Current Outpatient Medications  Medication Sig Dispense Refill   sulfamethoxazole-trimethoprim (BACTRIM DS) 800-160 MG tablet Take 1 tablet by mouth 2 (two) times daily. For 10 days.      Musculoskeletal: Strength & Muscle Tone: decreased Gait & Station:  did not witness Patient leans: N/A  Psychiatric Specialty Exam: Physical Exam Vitals and nursing note reviewed.  Constitutional:      Appearance: Normal appearance.  HENT:     Head: Normocephalic.     Nose: Nose normal.  Pulmonary:     Effort: Pulmonary effort is normal.  Musculoskeletal:        General: Normal range of motion.     Cervical back: Normal range of motion.  Neurological:     General: No focal deficit present.     Mental Status: She is alert and oriented to person, place, and time.  Psychiatric:        Attention and Perception: Attention and perception normal.        Mood and Affect: Mood is depressed.        Speech: Speech normal.        Behavior: Behavior normal. Behavior is cooperative.        Thought Content: Thought content normal.        Cognition and Memory: Cognition and memory normal.        Judgment: Judgment normal.    Review of Systems  Psychiatric/Behavioral:  Positive for depression.   All other systems reviewed and are negative.  Blood pressure 118/76, pulse (!) 102, temperature 97.7 F (36.5 C), resp. rate 20, height 5\' 5"  (1.651 m), weight 47 kg, SpO2 94 %.Body mass index is 17.24 kg/m.  General Appearance: Disheveled  Eye Contact:  Minimal  Speech:  Slow  Volume:  Decreased  Mood:  Depressed  Affect:  Congruent  Thought Process:  Coherent and Descriptions of Associations: Intact  Orientation:  Full (Time, Place, and Person)  Thought Content:  Logical  Suicidal Thoughts:  No  Homicidal Thoughts:  No  Memory:  Immediate;   Fair Recent;   Fair Remote;   Fair  Judgement:  Poor  Insight:  Lacking   Psychomotor Activity:  Decreased  Concentration:  Concentration: Poor and Attention Span: Poor  Recall:  [employer]Fair  Fund of Knowledge:  Fair  Language:  Fair  Akathisia:  No  Handed:  Right  AIMS (if indicated):     Assets:  Housing Leisure Time Resilience Social Support  ADL's:  Impaired  Cognition:  WNL  Sleep:        Physical Exam: Physical Exam Vitals and nursing note reviewed.  Constitutional:      Appearance: Normal appearance.  HENT:     Head: Normocephalic.     Nose: Nose normal.  Pulmonary:     Effort: Pulmonary effort is normal.  Musculoskeletal:  General: Normal range of motion.     Cervical back: Normal range of motion.  Neurological:     General: No focal deficit present.     Mental Status: She is alert and oriented to person, place, and time.  Psychiatric:        Attention and Perception: Attention and perception normal.        Mood and Affect: Mood is depressed.        Speech: Speech normal.        Behavior: Behavior normal. Behavior is cooperative.        Thought Content: Thought content normal.        Cognition and Memory: Cognition and memory normal.        Judgment: Judgment normal.   Review of Systems  Psychiatric/Behavioral:  Positive for depression.   All other systems reviewed and are negative. Blood pressure 118/76, pulse (!) 102, temperature 97.7 F (36.5 C), resp. rate 20, height  (1.651 m), weight 47 kg, SpO2 94 %. Body mass index is 17.24 kg/m.  Treatment Plan Summary: Daily contact with patient to assess and evaluate symptoms and progress in treatment, Medication management, and Plan : Depression with self-care neglect: -Started Lexapro 5 mg daily -psych will continue to follow  Disposition: Supportive therapy provided about ongoing stressors.  Nanine Means, NP 08/27/2021 10:47 AM

## 2021-08-27 NOTE — Progress Notes (Signed)
OT Cancellation Note  Patient Details Name: MONROE TOURE MRN: [id number] DOB: [birth year]   Cancelled Treatment:    Reason Eval/Treat Not Completed: Medical issues which prohibited therapy. Chart reviewed - pt noted to have Na critically low at 107; contraindicated for exertional activity at this time. Will continue to follow and initiate services as pt medically appropriate to participate in therapy.    Matthew Folks, OTR/L [facility] [phone]

## 2021-08-27 NOTE — ED Notes (Signed)
PT in room working with patient

## 2021-08-27 NOTE — Consult Note (Signed)
[facility]  CONSULT NOTE    Date: 08/27/2021                  Patient Name:  Karen Mooney  MRN: [id number]  DOB: [birth year]  Age / Sex: 60 y.o., female         PCP: Patient, No Pcp Per (Inactive)                 Service Requesting Consult: TRH                 Reason for Consult: Severe hyponatremia            History of Present Illness: Karen Mooney is a 60 y.o.  female with medical conditions including frequent UTI's, tobacco use and weight loss, who was admitted to [facility] on 08/26/2021 for Hyponatremia [E87.1]  Patient presents to the emergency department with complaints of generalized weakness and poor appetite.  At time of arrival family voiced concerns that patient may be intentionally not eating and making statements that she wants to die.  Patient placed under IVC protocol.  We have been consulted to evaluate and assist in management of severe hyponatremia.  Admitting sodium level at 103.  Recommended starting patient on sodium bicarb.  Patient seen this morning resting in bed requesting a cup of water.  States she is very thirsty.  Continues to have poor appetite, breakfast tray at bedside untouched.  Patient states she has not eaten in about 1 to 2 weeks.  Unable/unwilling to respond when asked why.  Denies nausea vomiting or diarrhea.  Denies shortness of breath.  Patient is alert yet slow to respond.  CT of abdomen pelvis show improving hydronephrosis.  CT of head shows old infarct.  Chest x-ray negative for cardiopulmonary disease.  Other pertinent labs include creatinine at 8.02, BUN of 109, and EGFR 5.  Baseline creatinine appears to be a year ago at 0.87 on 07/25/20.   Medications: Outpatient medications: (Not in a hospital admission)   Current medications: Current Facility-Administered Medications  Medication Dose Route Frequency Provider Last Rate Last Admin   acetaminophen (TYLENOL) tablet 650 mg  650 mg Oral Q6H PRN Doutova,  Anastassia, MD       Or   acetaminophen (TYLENOL) suppository 650 mg  650 mg Rectal Q6H PRN Toy Baker, MD       escitalopram (LEXAPRO) tablet 5 mg  5 mg Oral Daily Reita Cliche, Jamison Y, NP   5 mg at 08/27/21 1237   metoCLOPramide (REGLAN) injection 5 mg  5 mg Intravenous Q6H PRN Doutova, Nyoka Lint, MD       Muscle Rub CREA   Topical PRN Opyd, Ilene Qua, MD       nicotine (NICODERM CQ - dosed in mg/24 hr) patch 7 mg  7 mg Transdermal Daily Doutova, Anastassia, MD       sodium bicarbonate 150 mEq in dextrose 5 % 1,150 mL infusion   Intravenous Continuous Doutova, Anastassia, MD 60 mL/hr at 08/27/21 0000 New Bag at 08/27/21 0000   thiamine 545m in normal saline (522m IVPB  500 mg Intravenous NOW DoToy BakerMD   Held at 08/27/21 0229   Current Outpatient Medications  Medication Sig Dispense Refill   sulfamethoxazole-trimethoprim (BACTRIM DS) 800-160 MG tablet Take 1 tablet by mouth 2 (two) times daily. For 10 days.        Allergies: No Known Allergies    Past Medical History: History reviewed. No pertinent past medical  history.   Past Surgical History: Past Surgical History:  Procedure Laterality Date   ECTOPIC PREGNANCY SURGERY       Family History: Family History  Problem Relation Age of Onset   CAD Neg Hx    Diabetes Neg Hx      Social History: Social History   Socioeconomic History   Marital status: Single    Spouse name: Not on file   Number of children: Not on file   Years of education: Not on file   Highest education level: Not on file  Occupational History   Not on file  Tobacco Use   Smoking status: Every Day    Packs/day: 1.00    Types: Cigarettes   Smokeless tobacco: Never  Substance and Sexual Activity   Alcohol use: Yes   Drug use: Never   Sexual activity: Not on file  Other Topics Concern   Not on file  Social History Narrative   Not on file   Social Determinants of Health   Financial Resource Strain: Not on file  Food  Insecurity: Not on file  Transportation Needs: Not on file  Physical Activity: Not on file  Stress: Not on file  Social Connections: Not on file  Intimate Partner Violence: Not on file     Review of Systems: Review of Systems  Constitutional:  Positive for weight loss.  All other systems reviewed and are negative.  Vital Signs: Blood pressure 134/79, pulse 95, temperature 97.7 F (36.5 C), resp. rate 12, height 5' 5"  (1.651 m), weight 47 kg, SpO2 93 %.  Weight trends: Filed Weights   08/26/21 2148  Weight: 47 kg    Physical Exam: General: NAD, laying on stretcher  Head: Normocephalic, atraumatic. Moist oral mucosal membranes  Eyes: Anicteric  Lungs:  Clear to auscultation, normal effort  Heart: Regular rate and rhythm  Abdomen:  Soft, nontender  Extremities:  no peripheral edema.  Neurologic: Nonfocal, moving all four extremities  Skin: No lesions        Lab results: Basic Metabolic Panel: Recent Labs  Lab 08/26/21 2302 08/26/21 2321 08/27/21 0147 08/27/21 0421 08/27/21 1054  NA  --    < > 104* 106* 107*  K  --    < > 5.3* 4.8 4.4  CL  --    < > 70* 72* 74*  CO2  --    < > 17* 15* 17*  GLUCOSE  --    < > 116* 112* 112*  BUN  --    < > 121* 118* 109*  CREATININE  --    < > 9.08* 9.15* 8.02*  CALCIUM  --    < > 9.3 9.4 8.4*  MG 2.1  --   --  2.0  --   PHOS  --   --  7.2* 6.8*  --    < > = values in this interval not displayed.    Liver Function Tests: Recent Labs  Lab 08/26/21 2231 08/27/21 0421  AST 14* 14*  ALT 8 10  ALKPHOS 108 94  BILITOT 0.8 0.8  PROT 9.5* 8.7*  ALBUMIN 3.9 3.5   Recent Labs  Lab 08/26/21 2231  LIPASE 33   No results for input(s): AMMONIA in the last 168 hours.  CBC: Recent Labs  Lab 08/26/21 2231 08/27/21 0421  WBC 12.3* 12.2*  NEUTROABS 10.0* 8.8*  HGB 13.7 12.6  HCT 37.0 33.9*  MCV 89.4 85.8  PLT 704* 700*    Cardiac Enzymes: Recent  Labs  Lab 08/27/21 0147  CKTOTAL 276*    BNP: Invalid input(s):  POCBNP  CBG: No results for input(s): GLUCAP in the last 168 hours.  Microbiology: Results for orders placed or performed during the hospital encounter of 08/26/21  Resp Panel by RT-PCR (Flu A&B, Covid) Nasopharyngeal Swab     Status: None   Collection Time: 08/27/21  4:21 AM   Specimen: Nasopharyngeal Swab; Nasopharyngeal(NP) swabs in vial transport medium  Result Value Ref Range Status   SARS Coronavirus 2 by RT PCR NEGATIVE NEGATIVE Final    Comment: (NOTE) SARS-CoV-2 target nucleic acids are NOT DETECTED.  The SARS-CoV-2 RNA is generally detectable in upper respiratory specimens during the acute phase of infection. The lowest concentration of SARS-CoV-2 viral copies this assay can detect is 138 copies/mL. A negative result does not preclude SARS-Cov-2 infection and should not be used as the sole basis for treatment or other patient management decisions. A negative result may occur with  improper specimen collection/handling, submission of specimen other than nasopharyngeal swab, presence of viral mutation(s) within the areas targeted by this assay, and inadequate number of viral copies(<138 copies/mL). A negative result must be combined with clinical observations, patient history, and epidemiological information. The expected result is Negative.  Fact Sheet for Patients:  EntrepreneurPulse.com.au  Fact Sheet for Healthcare Providers:  IncredibleEmployment.be  This test is no t yet approved or cleared by the [place] FDA and  has been authorized for detection and/or diagnosis of SARS-CoV-2 by FDA under an Emergency Use Authorization (EUA). This EUA will remain  in effect (meaning this test can be used) for the duration of the COVID-19 declaration under Section 564(b)(1) of the Act, 21 U.S.C.section 360bbb-3(b)(1), unless the authorization is terminated  or revoked sooner.       Influenza A by PCR NEGATIVE NEGATIVE Final    Influenza B by PCR NEGATIVE NEGATIVE Final    Comment: (NOTE) The Xpert Xpress SARS-CoV-2/FLU/RSV plus assay is intended as an aid in the diagnosis of influenza from Nasopharyngeal swab specimens and should not be used as a sole basis for treatment. Nasal washings and aspirates are unacceptable for Xpert Xpress SARS-CoV-2/FLU/RSV testing.  Fact Sheet for Patients: EntrepreneurPulse.com.au  Fact Sheet for Healthcare Providers: IncredibleEmployment.be  This test is not yet approved or cleared by the [place] FDA and has been authorized for detection and/or diagnosis of SARS-CoV-2 by FDA under an Emergency Use Authorization (EUA). This EUA will remain in effect (meaning this test can be used) for the duration of the COVID-19 declaration under Section 564(b)(1) of the Act, 21 U.S.C. section 360bbb-3(b)(1), unless the authorization is terminated or revoked.  Performed at [facility], [address] [place], [place] [address]     Coagulation Studies: Recent Labs    08/27/21 0006  LABPROT 13.5  INR 1.0    Urinalysis: Recent Labs    08/27/21 0006  COLORURINE YELLOW  LABSPEC 1.020  PHURINE 6.0  GLUCOSEU NEGATIVE  HGBUR LARGE*  BILIRUBINUR NEGATIVE  KETONESUR TRACE*  PROTEINUR >300*  NITRITE NEGATIVE  LEUKOCYTESUR LARGE*      Imaging: CT ABDOMEN PELVIS WO CONTRAST  Result Date: 08/27/2021 CLINICAL DATA:  Abdominal pain EXAM: CT ABDOMEN AND PELVIS WITHOUT CONTRAST TECHNIQUE: Multidetector CT imaging of the abdomen and pelvis was performed following the standard protocol without IV contrast. COMPARISON:  [id number] FINDINGS: Lower chest: No acute abnormality Hepatobiliary: No focal hepatic abnormality. Gallbladder unremarkable. Pancreas: No focal abnormality or ductal dilatation. Spleen: No focal abnormality.  Normal  size. Adrenals/Urinary Tract: Numerous bilateral nonobstructing stones. There is mild to moderate bilateral  hydronephrosis, decreased since prior study, left greater than right. Bilateral nephrolithiasis. No ureteral stones. No adrenal mass. Urinary bladder unremarkable. Stomach/Bowel: Stomach, large and small bowel grossly unremarkable. Vascular/Lymphatic: Aortic atherosclerosis. No evidence of aortic aneurysm. No adenopathy. Reproductive: Prior hysterectomy.  No adnexal masses. Other: No free fluid or free air. Musculoskeletal: No acute bony abnormality. 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 IMPRESSION: Chronic mild to moderate bilateral hydronephrosis has improved since prior study. No ureteral stones. Bilateral nephrolithiasis. Aortic atherosclerosis. No acute findings. Electronically Signed   By: Rolm Baptise M.D.   On: 08/27/2021 00:44   DG Chest 2 View  Result Date: 08/27/2021 CLINICAL DATA:  Weakness, anorexia EXAM: CHEST - 2 VIEW COMPARISON:  None. FINDINGS: The heart size and mediastinal contours are within normal limits. Both lungs are clear. The visualized skeletal structures are unremarkable. IMPRESSION: No active cardiopulmonary disease. Electronically Signed   By: Fidela Salisbury M.D.   On: 08/27/2021 00:47   CT HEAD WO CONTRAST (5MM)  Result Date: 08/27/2021 CLINICAL DATA:  Delirium EXAM: CT HEAD WITHOUT CONTRAST TECHNIQUE: Contiguous axial images were obtained from the base of the skull through the vertex without intravenous contrast. COMPARISON:  None. FINDINGS: Brain: Old left cerebellar infarct. Mild diffuse cerebral atrophy. No acute intracranial abnormality. Specifically, no hemorrhage, hydrocephalus, mass lesion, acute infarction, or significant intracranial injury. Vascular: No hyperdense vessel or unexpected calcification. Skull: No acute calvarial abnormality. Sinuses/Orbits: No acute findings Other: None IMPRESSION: Old left cerebellar infarct. No acute intracranial abnormality. Electronically Signed   By: Rolm Baptise M.D.   On:  08/27/2021 02:09     Assessment & Plan: Karen Mooney is a 60 y.o.  female with with medical conditions including frequent UTI's, tobacco use and weight loss, who was admitted to [facility] on 08/26/2021 for Hyponatremia [E87.1]   Severe hyponatremia due to malnutrition  Admission level was 103. Since patient reports not eating for 1-2 weeks, recommend slow infusion of Sodium Bicarb. Will continue to monitor frequent sodium levels. Latest level at 107. Encourage oral nutrition. Avoid other sodium supplements for now.  2. Acute kidney injury secondary to ATN caused by severe malnutrition. Baseline creatinine 0.87 on 07/25/20.  ED arrival 9.08 with eGFR 5%. Will monitor with hydration. CT abd/pelvis show improving hydronephrosis. No exposure to contrast. No acute indication for dialysis, but will monitor closely. Continue IVF as above    LOS: 0 Shad Ledvina 9/4/20222:08 PM

## 2021-08-27 NOTE — ED Notes (Signed)
Provider at bedside

## 2021-08-27 NOTE — ED Notes (Signed)
Pt lying in bed currently asleep. Pt still in seizure precautions, but pt at this time stable.

## 2021-08-27 NOTE — ED Provider Notes (Signed)
-----------------------------------------   12:10 AM on 08/27/2021 -----------------------------------------  The patient arrived under IVC due to family concerns for possibly intentionally not eating and statements that she made stating that she wanted to die.  I took over the ED from Dr. Erma Heritage, and at that time we had discussed maintaining the IVC and having the patient seen by psychiatry simultaneously with medical work-up.Marland Kitchen  He has signed this patient out to the hospitalist; she is being admitted for significant hyponatremia which is likely causing her symptoms, however I will maintain IVC until psychiatry can see her and potentially clear her.   Dionne Bucy, MD 08/27/21 [phone]

## 2021-08-27 NOTE — Progress Notes (Signed)
PROGRESS NOTE    ALISEA MATTE  QAS:[id number] DOB: 1961/03/02 DOA: 08/26/2021 PCP: Patient, No Pcp Per (Inactive)  ED25A/ED25A   Assessment & Plan:   Active Problems:   Hyponatremia   AKI (acute kidney injury) (HCC)   Hematuria   Weight loss   Malnutrition due to starvation (HCC)   Hyperkalemia   Tobacco abuse   Nausea & vomiting   Depression   ROSAMARIA DONN is a 60 y.o. female with medical history significant of recurrent UTIs, tobacco abuse, weight loss whose family filed for IVC and brought pt to the hospital.  Family has been concerned that patient has been losing weight and not eating.  Was seen at [facility] on 27 August for presumed UTI was given prescription for Bactrim and Pyridium UA at the time showed persistent infection.    Hyponatremia  - Na 103 on presentation.  combination of decreased PO intake, dehydration and high water intake --started on bicarb gtt, per nephro rec Plan: --cont bicarb gtt --Na check q8h   AKI  --secondary to ATN caused by severe malnutrition. Baseline creatinine 0.87 on 07/25/20.  ED arrival 9.08. --CT abd/pelvis show improving hydronephrosis. No exposure to contrast. Plan: --cont bicarb gtt   Persistent nausea and vomiting --CT abdomen unremarkable except for improving hydronephrosis.   --supportive care for now    Hematuria  -CT abdomen showed Numerous bilateral nonobstructing stones. --follow up urine cx    Weight loss  -states this is secondary to decreased p.o. intake.  --nutrition consult    Hyperkalemia, resolved --resolved with IVF    Tobacco abuse --cessation advised --Nicotine patch  Compensated metabolic acidosis --anion gap, low bicarb, pCO2 low at 27 --cont bicarb gtt   DVT prophylaxis: Heparin SQ Code Status: Full code  Family Communication:  Level of care: Stepdown Dispo:   The patient is from: home Anticipated d/c is to: undetermined Anticipated d/c date is: undetermined Patient currently is  not medically ready to d/c due to: severe hyponatremia, not eating   Subjective and Interval History:  Pt asked for water.  Didn't want to be in the hospital.  When asked, admitted to having pain with eating.  Also have N/V daily for at least 2 weeks.   Objective: Vitals:   08/27/21 1600 08/27/21 1630 08/27/21 1700 08/27/21 1730  BP: (!) 145/88 131/89 125/82 128/79  Pulse: 93 92 90 90  Resp: 15 13 12 12   Temp:      TempSrc:      SpO2: 96% 95% 94% 95%  Weight:      Height:       No intake or output data in the 24 hours ending 08/27/21 1747 Filed Weights   08/26/21 2148  Weight: 47 kg    Examination:   Constitutional: NAD, AAOx3 HEENT: conjunctivae and lids normal, EOMI CV: No cyanosis.   RESP: normal respiratory effort, on RA Extremities: No effusions, edema in BLE SKIN: warm, dry Neuro: II - XII grossly intact.   Psych: depressed mood and affect.     Data Reviewed: I have personally reviewed following labs and imaging studies  CBC: Recent Labs  Lab 08/26/21 2231 08/27/21 0421  WBC 12.3* 12.2*  NEUTROABS 10.0* 8.8*  HGB 13.7 12.6  HCT 37.0 33.9*  MCV 89.4 85.8  PLT 704* 700*   Basic Metabolic Panel: Recent Labs  Lab 08/26/21 2302 08/26/21 2321 08/27/21 0147 08/27/21 0421 08/27/21 1054 08/27/21 1427  NA  --  105* 104* 106* 107* 106*  K  --  5.5* 5.3* 4.8 4.4 3.6  CL  --  72* 70* 72* 74* 68*  CO2  --  15* 17* 15* 17* 19*  GLUCOSE  --  112* 116* 112* 112* 133*  BUN  --  118* 121* 118* 109* 117*  CREATININE  --  9.22* 9.08* 9.15* 8.02* 8.61*  CALCIUM  --  9.2 9.3 9.4 8.4* 9.1  MG 2.1  --   --  2.0  --   --   PHOS  --   --  7.2* 6.8*  --   --    GFR: Estimated Creatinine Clearance: 5.2 mL/min (A) (by C-G formula based on SCr of 8.61 mg/dL (H)). Liver Function Tests: Recent Labs  Lab 08/26/21 2231 08/27/21 0421  AST 14* 14*  ALT 8 10  ALKPHOS 108 94  BILITOT 0.8 0.8  PROT 9.5* 8.7*  ALBUMIN 3.9 3.5   Recent Labs  Lab 08/26/21 2231   LIPASE 33   No results for input(s): AMMONIA in the last 168 hours. Coagulation Profile: Recent Labs  Lab 08/27/21 0006  INR 1.0   Cardiac Enzymes: Recent Labs  Lab 08/27/21 0147  CKTOTAL 276*   BNP (last 3 results) No results for input(s): PROBNP in the last 8760 hours. HbA1C: No results for input(s): HGBA1C in the last 72 hours. CBG: No results for input(s): GLUCAP in the last 168 hours. Lipid Profile: No results for input(s): CHOL, HDL, LDLCALC, TRIG, CHOLHDL, LDLDIRECT in the last 72 hours. Thyroid Function Tests: Recent Labs    08/27/21 0421  TSH 0.475   Anemia Panel: Recent Labs    08/27/21 0421  VITAMINB12 161*  FOLATE 10.5  FERRITIN 461*  TIBC 255  IRON 129  RETICCTPCT 1.1   Sepsis Labs: Recent Labs  Lab 08/27/21 0006  LATICACIDVEN 0.9    Recent Results (from the past 240 hour(s))  Resp Panel by RT-PCR (Flu A&B, Covid) Nasopharyngeal Swab     Status: None   Collection Time: 08/27/21  4:21 AM   Specimen: Nasopharyngeal Swab; Nasopharyngeal(NP) swabs in vial transport medium  Result Value Ref Range Status   SARS Coronavirus 2 by RT PCR NEGATIVE NEGATIVE Final    Comment: (NOTE) SARS-CoV-2 target nucleic acids are NOT DETECTED.  The SARS-CoV-2 RNA is generally detectable in upper respiratory specimens during the acute phase of infection. The lowest concentration of SARS-CoV-2 viral copies this assay can detect is 138 copies/mL. A negative result does not preclude SARS-Cov-2 infection and should not be used as the sole basis for treatment or other patient management decisions. A negative result may occur with  improper specimen collection/handling, submission of specimen other than nasopharyngeal swab, presence of viral mutation(s) within the areas targeted by this assay, and inadequate number of viral copies(<138 copies/mL). A negative result must be combined with clinical observations, patient history, and epidemiological information. The  expected result is Negative.  Fact Sheet for Patients:  BloggerCourse.com  Fact Sheet for Healthcare Providers:  SeriousBroker.it  This test is no t yet approved or cleared by the [place] FDA and  has been authorized for detection and/or diagnosis of SARS-CoV-2 by FDA under an Emergency Use Authorization (EUA). This EUA will remain  in effect (meaning this test can be used) for the duration of the COVID-19 declaration under Section 564(b)(1) of the Act, 21 U.S.C.section 360bbb-3(b)(1), unless the authorization is terminated  or revoked sooner.       Influenza A by PCR NEGATIVE NEGATIVE Final   Influenza B by PCR NEGATIVE  NEGATIVE Final    Comment: (NOTE) The Xpert Xpress SARS-CoV-2/FLU/RSV plus assay is intended as an aid in the diagnosis of influenza from Nasopharyngeal swab specimens and should not be used as a sole basis for treatment. Nasal washings and aspirates are unacceptable for Xpert Xpress SARS-CoV-2/FLU/RSV testing.  Fact Sheet for Patients: BloggerCourse.com  Fact Sheet for Healthcare Providers: SeriousBroker.it  This test is not yet approved or cleared by the [place] FDA and has been authorized for detection and/or diagnosis of SARS-CoV-2 by FDA under an Emergency Use Authorization (EUA). This EUA will remain in effect (meaning this test can be used) for the duration of the COVID-19 declaration under Section 564(b)(1) of the Act, 21 U.S.C. section 360bbb-3(b)(1), unless the authorization is terminated or revoked.  Performed at [facility], [address] [place], [place] [address]       Radiology Studies: CT ABDOMEN PELVIS WO CONTRAST  Result Date: 08/27/2021 CLINICAL DATA:  Abdominal pain EXAM: CT ABDOMEN AND PELVIS WITHOUT CONTRAST TECHNIQUE: Multidetector CT imaging of the abdomen and pelvis was performed following the standard  protocol without IV contrast. COMPARISON:  [id number] FINDINGS: Lower chest: No acute abnormality Hepatobiliary: No focal hepatic abnormality. Gallbladder unremarkable. Pancreas: No focal abnormality or ductal dilatation. Spleen: No focal abnormality.  Normal size. Adrenals/Urinary Tract: Numerous bilateral nonobstructing stones. There is mild to moderate bilateral hydronephrosis, decreased since prior study, left greater than right. Bilateral nephrolithiasis. No ureteral stones. No adrenal mass. Urinary bladder unremarkable. Stomach/Bowel: Stomach, large and small bowel grossly unremarkable. Vascular/Lymphatic: Aortic atherosclerosis. No evidence of aortic aneurysm. No adenopathy. Reproductive: Prior hysterectomy.  No adnexal masses. Other: No free fluid or free air. Musculoskeletal: No acute bony abnormality. 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 IMPRESSION: Chronic mild to moderate bilateral hydronephrosis has improved since prior study. No ureteral stones. Bilateral nephrolithiasis. Aortic atherosclerosis. No acute findings. Electronically Signed   By: Charlett Nose M.D.   On: 08/27/2021 00:44   DG Chest 2 View  Result Date: 08/27/2021 CLINICAL DATA:  Weakness, anorexia EXAM: CHEST - 2 VIEW COMPARISON:  None. FINDINGS: The heart size and mediastinal contours are within normal limits. Both lungs are clear. The visualized skeletal structures are unremarkable. IMPRESSION: No active cardiopulmonary disease. Electronically Signed   By: Helyn Numbers M.D.   On: 08/27/2021 00:47   CT HEAD WO CONTRAST ( )  Result Date: 08/27/2021 CLINICAL DATA:  Delirium EXAM: CT HEAD WITHOUT CONTRAST TECHNIQUE: Contiguous axial images were obtained from the base of the skull through the vertex without intravenous contrast. COMPARISON:  None. FINDINGS: Brain: Old left cerebellar infarct. Mild diffuse cerebral atrophy. No acute intracranial abnormality. Specifically, no  hemorrhage, hydrocephalus, mass lesion, acute infarction, or significant intracranial injury. Vascular: No hyperdense vessel or unexpected calcification. Skull: No acute calvarial abnormality. Sinuses/Orbits: No acute findings Other: None IMPRESSION: Old left cerebellar infarct. No acute intracranial abnormality. Electronically Signed   By: Charlett Nose M.D.   On: 08/27/2021 02:09     Scheduled Meds:  escitalopram  5 mg Oral Daily   nicotine  7 mg Transdermal Daily   Continuous Infusions:  sodium bicarbonate 150 mEq in D5W infusion 60 mL/hr at 08/27/21 0000   thiamine injection Stopped (08/27/21 0229)     LOS: 0 days     Darlin Priestly, MD [facility] If 7PM-7AM, please contact night-coverage 08/27/2021, 5:47 PM

## 2021-08-27 NOTE — ED Notes (Signed)
Primary RN jennifer notified  Pts NA 107 per lab.

## 2021-08-27 NOTE — ED Notes (Signed)
Pt lying in bed trying to sleep. Seizure pads applied to sides of bed due to low sodium. Pt vitals stable at this time.

## 2021-08-28 DIAGNOSIS — F4320 Adjustment disorder, unspecified: Secondary | ICD-10-CM

## 2021-08-28 LAB — PROTEIN / CREATININE RATIO, URINE
Creatinine, Urine: 85 mg/dL
Protein Creatinine Ratio: 1.47 mg/mg{Cre} — ABNORMAL HIGH (ref 0.00–0.15)
Total Protein, Urine: 125 mg/dL

## 2021-08-28 LAB — CBC
Hemoglobin: 12.3 g/dL (ref 12.0–15.0)
Platelets: 655 10*3/uL — ABNORMAL HIGH (ref 150–400)
WBC: 9.6 10*3/uL (ref 4.0–10.5)

## 2021-08-28 LAB — URINE CULTURE: Culture: NO GROWTH

## 2021-08-28 LAB — MAGNESIUM: Magnesium: 1.9 mg/dL (ref 1.7–2.4)

## 2021-08-28 LAB — HEPATITIS PANEL, ACUTE
HCV Ab: NONREACTIVE
Hep A IgM: NONREACTIVE
Hep B C IgM: REACTIVE — AB
Hepatitis B Surface Ag: NONREACTIVE

## 2021-08-28 LAB — BASIC METABOLIC PANEL
Anion gap: 16 — ABNORMAL HIGH (ref 5–15)
BUN: 102 mg/dL — ABNORMAL HIGH (ref 6–20)
CO2: 25 mmol/L (ref 22–32)
Calcium: 8.9 mg/dL (ref 8.9–10.3)
Chloride: 66 mmol/L — ABNORMAL LOW (ref 98–111)
Creatinine, Ser: 7.24 mg/dL — ABNORMAL HIGH (ref 0.44–1.00)
GFR, Estimated: 6 mL/min — ABNORMAL LOW (ref >60)
Glucose, Bld: 131 mg/dL — ABNORMAL HIGH (ref 70–99)
Potassium: 3.4 mmol/L — ABNORMAL LOW (ref 3.5–5.1)
Sodium: 107 mmol/L — CL (ref 135–145)

## 2021-08-28 LAB — SODIUM
Sodium: 105 mmol/L — CL (ref 135–145)
Sodium: 112 mmol/L — CL (ref 135–145)

## 2021-08-28 MED ORDER — SODIUM CHLORIDE 0.9 % IV SOLN: INTRAVENOUS | Status: DC

## 2021-08-28 NOTE — ED Notes (Signed)
Md notified of Na level of 105 as reported by lab. Stated  ' I have seen the result.

## 2021-08-28 NOTE — ED Notes (Signed)
Pt sleeping with family at bedside.

## 2021-08-28 NOTE — ED Provider Notes (Signed)
Patient seen by Dr. Toni Amend who does not have any concerns about her mental health at this time.  IVC DC'd.   Georga Hacking, MD 08/28/21 [phone]

## 2021-08-28 NOTE — Progress Notes (Signed)
OT Cancellation Note  Patient Details Name: Karen Mooney MRN: [id number] DOB: 30-Mar-1961   Cancelled Treatment:    Reason Eval/Treat Not Completed: Medical issues which prohibited therapy. Per chart review, pt continues to have critically low Na at 107, which is contraindicated for therapy at this time. Will continue to follow and initiate services as pt medically appropriate to participate in therapy.    Matthew Folks, OTR/L [facility] [phone]

## 2021-08-28 NOTE — Consult Note (Signed)
[facility] Face-to-Face Psychiatry Consult   Reason for Consult: Consult for 60 year old woman who was brought to the hospital under IVC evidently in order to force her to come to the emergency room Referring Physician: Sidney Ace Patient Identification: Karen Mooney MRN:  [id number] Principal Diagnosis: Adjustment disorder Diagnosis:  Principal Problem:   Adjustment disorder Active Problems:   Hyponatremia   AKI (acute kidney injury) (HCC)   Hematuria   Weight loss   Malnutrition due to starvation (HCC)   Hyperkalemia   Tobacco abuse   Nausea & vomiting   Depression   Total Time spent with patient: 1 hour  Subjective:   Karen Mooney is a 60 y.o. female patient admitted with "I have been sick".  HPI: Patient seen chart reviewed.  60 year old woman without past psychiatric history.  Apparently she has been nauseous and vomiting for a couple of weeks which has only progressed despite attempts at treatment.  Family became concerned that she was not eating or drinking regularly only throwing up not functioning normally not getting up.  Evidently a decision was made to take out IVC to force her to come to the emergency room.  I called her brother to try and understand how this decision was made but was not able to reach him.  On interview the patient says that she has been drinking some water but not able to eat any solid food for at least a week.  Throwing up frequently.  Feeling sick.  Not able to go to work regularly.  Patient has a urinary tract infection and has been seen at other emergency rooms and put on antibiotics but continues to have the infection.  Not clear if she was ever compliant with ordered medication.  Patient denies any suicidal thoughts or intent.  She denies any hallucinations or psychotic symptoms.  Denies feeling depressed.  Past Psychiatric History: Patient has depression listed on 1 problem set but there is no past known psychiatric history identified.  No known history  of treatment hospitalization.  Denies any past suicide attempt.  Denies substance abuse  Risk to Self:   Risk to Others:   Prior Inpatient Therapy:   Prior Outpatient Therapy:    Past Medical History: History reviewed. No pertinent past medical history.  Past Surgical History:  Procedure Laterality Date   ECTOPIC PREGNANCY SURGERY     Family History:  Family History  Problem Relation Age of Onset   CAD Neg Hx    Diabetes Neg Hx    Family Psychiatric  History: Not aware of any Social History:  Social History   Substance and Sexual Activity  Alcohol Use Yes     Social History   Substance and Sexual Activity  Drug Use Never    Social History   Socioeconomic History   Marital status: Single    Spouse name: Not on file   Number of children: Not on file   Years of education: Not on file   Highest education level: Not on file  Occupational History   Not on file  Tobacco Use   Smoking status: Every Day    Packs/day: 1.00    Types: Cigarettes   Smokeless tobacco: Never  Substance and Sexual Activity   Alcohol use: Yes   Drug use: Never   Sexual activity: Not on file  Other Topics Concern   Not on file  Social History Narrative   Not on file   Social Determinants of Health   Financial Resource Strain: Not on  file  Food Insecurity: Not on file  Transportation Needs: Not on file  Physical Activity: Not on file  Stress: Not on file  Social Connections: Not on file   Additional Social History:    Allergies:  No Known Allergies  Labs:  Results for orders placed or performed during the hospital encounter of 08/26/21 (from the past 48 hour(s))  CBC with Differential     Status: Abnormal   Collection Time: 08/26/21 10:31 PM  Result Value Ref Range   WBC 12.3 (H) 4.0 - 10.5 K/uL   RBC 4.14 3.87 - 5.11 MIL/uL   Hemoglobin 13.7 12.0 - 15.0 g/dL   HCT 16.137.0 09.636.0 - 04.546.0 %   MCV 89.4 80.0 - 100.0 fL   MCH 33.1 26.0 - 34.0 pg   MCHC 37.0 (H) 30.0 - 36.0 g/dL    RDW 40.911.8 81.111.5 - 91.415.5 %   Platelets 704 (H) 150 - 400 K/uL   nRBC 0.0 0.0 - 0.2 %   Neutrophils Relative % 81 %   Neutro Abs 10.0 (H) 1.7 - 7.7 K/uL   Lymphocytes Relative 11 %   Lymphs Abs 1.3 0.7 - 4.0 K/uL   Monocytes Relative 5 %   Monocytes Absolute 0.6 0.1 - 1.0 K/uL   Eosinophils Relative 0 %   Eosinophils Absolute 0.0 0.0 - 0.5 K/uL   Basophils Relative 0 %   Basophils Absolute 0.0 0.0 - 0.1 K/uL   Immature Granulocytes 3 %   Abs Immature Granulocytes 0.35 (H) 0.00 - 0.07 K/uL    Comment: Performed at [facility], [address] [place], [place] [address]  Comprehensive metabolic panel     Status: Abnormal   Collection Time: 08/26/21 10:31 PM  Result Value Ref Range   Sodium 103 (LL) 135 - 145 mmol/L    Comment: CRITICAL RESULT CALLED TO, READ BACK BY AND VERIFIED WITH: KATIE ALLRED AT 2318 08/26/21 DAS    Potassium 5.6 (H) 3.5 - 5.1 mmol/L   Chloride 67 (L) 98 - 111 mmol/L   CO2 16 (L) 22 - 32 mmol/L   Glucose, Bld 119 (H) 70 - 99 mg/dL    Comment: Glucose reference range applies only to samples taken after fasting for at least 8 hours.   BUN 127 (H) 6 - 20 mg/dL    Comment: RESULTS CONFIRMED BY MANUAL DILUTION   Creatinine, Ser 9.46 (H) 0.44 - 1.00 mg/dL   Calcium 9.6 8.9 - 62.110.3 mg/dL   Total Protein 9.5 (H) 6.5 - 8.1 g/dL   Albumin 3.9 3.5 - 5.0 g/dL   AST 14 (L) 15 - 41 U/L   ALT 8 0 - 44 U/L   Alkaline Phosphatase 108 38 - 126 U/L   Total Bilirubin 0.8 0.3 - 1.2 mg/dL   GFR, Estimated 4 (L) >60 mL/min    Comment: (NOTE) Calculated using the CKD-EPI Creatinine Equation (2021)    Anion gap 20 (H) 5 - 15    Comment: Performed at [facility], [address] [place], [place] [address]  Ethanol     Status: None   Collection Time: 08/26/21 10:31 PM  Result Value Ref Range   Alcohol, Ethyl (B) <10 <10 mg/dL    Comment: (NOTE) Lowest detectable limit for serum alcohol is 10 mg/dL.  For medical purposes only. Performed at [facility], [address] [place], [place] [address]   Lipase, blood     Status: None   Collection Time: 08/26/21 10:31 PM  Result Value Ref Range   Lipase 33 11 - 51 U/L    Comment: Performed at [facility], [address] [place], [place] [address]  TSH     Status: None   Collection Time: 08/26/21 11:02 PM  Result Value Ref Range   TSH 0.720 0.350 - 4.500 uIU/mL    Comment: Performed by a 3rd Generation assay with a functional sensitivity of <=0.01 uIU/mL. Performed at [facility], [address] [place], [place] [address]   Magnesium     Status: None   Collection Time: 08/26/21 11:02 PM  Result Value Ref Range   Magnesium 2.1 1.7 - 2.4 mg/dL    Comment: Performed at [facility], [address] [place], [place] [address]  Osmolality     Status: Abnormal   Collection Time: 08/26/21 11:12 PM  Result Value Ref Range   Osmolality 265 (L) 275 - 295 mOsm/kg    Comment: Performed at [facility], [address], [place], [place] [address]  Basic metabolic panel     Status: Abnormal   Collection Time: 08/26/21 11:21 PM  Result Value Ref Range   Sodium 105 (LL) 135 - 145 mmol/L    Comment: CRITICAL RESULT CALLED TO, READ BACK BY AND VERIFIED WITH KATIE ALLRED AT 0021 08/27/21 DAS    Potassium 5.5 (H) 3.5 - 5.1 mmol/L   Chloride 72 (L) 98 - 111 mmol/L   CO2 15 (L) 22 - 32 mmol/L   Glucose, Bld 112 (H) 70 - 99 mg/dL    Comment: Glucose reference range applies only to samples taken after fasting for at least 8 hours.   BUN 118 (H) 6 - 20 mg/dL    Comment: RESULT CONFIRMED BY MANUAL DILUTION DAS   Creatinine, Ser 9.22 (H) 0.44 - 1.00 mg/dL   Calcium 9.2 8.9 - 65.7 mg/dL   GFR, Estimated 4 (L) >60 mL/min    Comment: (NOTE) Calculated using the CKD-EPI Creatinine Equation (2021)    Anion gap 18 (H) 5 - 15    Comment: Performed at [facility], [address] [place], [place] [address]  Blood gas, venous     Status: Abnormal  (Preliminary result)   Collection Time: 08/26/21 11:58 PM  Result Value Ref Range   FIO2 PENDING    pH, Ven 7.34 7.250 - 7.430   pCO2, Ven 27 (L) 44.0 - 60.0 mmHg   pO2, Ven 45.0 32.0 - 45.0 mmHg   Bicarbonate 14.6 (L) 20.0 - 28.0 mmol/L   Acid-base deficit 9.6 (H) 0.0 - 2.0 mmol/L   O2 Saturation 77.5 %   Patient temperature 37.0    Collection site VENOUS    Sample type VENOUS     Comment: Performed at [facility], [address] [place], [place] [address]   Mechanical Rate PENDING   Urinalysis, Routine w reflex microscopic     Status: Abnormal   Collection Time: 08/27/21 12:06 AM  Result Value Ref Range   Color, Urine YELLOW YELLOW   APPearance CLOUDY (A) CLEAR   Specific Gravity, Urine 1.020 1.005 - 1.030   pH 6.0 5.0 - 8.0   Glucose, UA NEGATIVE NEGATIVE mg/dL   Hgb urine dipstick LARGE (A) NEGATIVE   Bilirubin Urine NEGATIVE NEGATIVE   Ketones, ur TRACE (A) NEGATIVE mg/dL   Protein, ur >413 (A) NEGATIVE mg/dL   Nitrite NEGATIVE NEGATIVE   Leukocytes,Ua LARGE (A) NEGATIVE   RBC / HPF >50 (H) 0 - 5 RBC/hpf  WBC, UA >50 (H) 0 - 5 WBC/hpf   Bacteria, UA NONE SEEN NONE SEEN   Squamous Epithelial / LPF NONE SEEN 0 - 5   WBC Clumps PRESENT    Budding Yeast PRESENT     Comment: Performed at [facility], [address] [place], [place] [address]  Osmolality, urine     Status: Abnormal   Collection Time: 08/27/21 12:06 AM  Result Value Ref Range   Osmolality, Ur 291 (L) 300 - 900 mOsm/kg    Comment: Performed at [facility], [address] [place], [place] [address]  Sodium, urine, random     Status: None   Collection Time: 08/27/21 12:06 AM  Result Value Ref Range   Sodium, Ur 63 mmol/L    Comment: Performed at [facility], [address] [place], [place] [address]  Protime-INR     Status: None   Collection Time: 08/27/21 12:06 AM  Result Value Ref Range   Prothrombin Time 13.5 11.4 - 15.2 seconds   INR 1.0 0.8 - 1.2     Comment: (NOTE) INR goal varies based on device and disease states. Performed at [facility], [address] [place], [place] [address]   Lactic acid, plasma     Status: None   Collection Time: 08/27/21 12:06 AM  Result Value Ref Range   Lactic Acid, Venous 0.9 0.5 - 1.9 mmol/L    Comment: Performed at [facility], [address], [place], [place] [address]  Urine Culture     Status: None   Collection Time: 08/27/21 12:06 AM   Specimen: Urine, Clean Catch  Result Value Ref Range   Specimen Description      URINE, CLEAN CATCH Performed at [facility], [address], [place], [place] [address]    Special Requests      NONE Performed at [facility], [address], [place], [place] [address]    Culture      NO GROWTH Performed at [facility], [address], [place], [place] [address]    Report Status 08/28/2021 FINAL   Urine Drug Screen, Qualitative ([facility] only)     Status: None   Collection Time: 08/27/21 12:06 AM  Result Value Ref Range   Tricyclic, Ur Screen NONE DETECTED NONE DETECTED   Amphetamines, Ur Screen NONE DETECTED NONE DETECTED   MDMA (Ecstasy)Ur Screen NONE DETECTED NONE DETECTED   Cocaine Metabolite,Ur [place] NONE DETECTED NONE DETECTED   Opiate, Ur Screen NONE DETECTED NONE DETECTED   Phencyclidine (PCP) Ur S NONE DETECTED NONE DETECTED   Cannabinoid 50 Ng, Ur [place] NONE DETECTED NONE DETECTED   Barbiturates, Ur Screen NONE DETECTED NONE DETECTED   Benzodiazepine, Ur Scrn NONE DETECTED NONE DETECTED   Methadone Scn, Ur NONE DETECTED NONE DETECTED    Comment: (NOTE) Tricyclics + metabolites, urine    Cutoff 1000 ng/mL Amphetamines + metabolites, urine  Cutoff 1000 ng/mL MDMA (Ecstasy), urine              Cutoff 500 ng/mL Cocaine Metabolite, urine          Cutoff 300 ng/mL Opiate + metabolites, urine        Cutoff 300 ng/mL Phencyclidine (PCP), urine         Cutoff 25 ng/mL Cannabinoid, urine                  Cutoff 50 ng/mL Barbiturates + metabolites, urine  Cutoff 200 ng/mL Benzodiazepine, urine  Cutoff 200 ng/mL Methadone, urine                   Cutoff 300 ng/mL  The urine drug screen provides only a preliminary, unconfirmed analytical test result and should not be used for non-medical purposes. Clinical consideration and professional judgment should be applied to any positive drug screen result due to possible interfering substances. A more specific alternate chemical method must be used in order to obtain a confirmed analytical result. Gas chromatography / mass spectrometry (GC/MS) is the preferred confirm atory method. Performed at [facility], [address] [place], [place] [address]   Hepatitis panel, acute     Status: Abnormal   Collection Time: 08/27/21  1:08 AM  Result Value Ref Range   Hepatitis B Surface Ag NON REACTIVE NON REACTIVE   HCV Ab NON REACTIVE NON REACTIVE    Comment: (NOTE) Nonreactive HCV antibody screen is consistent with no HCV infections,  unless recent infection is suspected or other evidence exists to indicate HCV infection.     Hep A IgM NON REACTIVE NON REACTIVE   Hep B C IgM Reactive (A) NON REACTIVE    Comment: HEALTH DEPARTMENT NOTIFIED Performed at [facility], [address] [place], [place] [address]   Sedimentation rate     Status: Abnormal   Collection Time: 08/27/21  1:08 AM  Result Value Ref Range   Sed Rate 66 (H) 0 - 30 mm/hr    Comment: Performed at [facility], [address] [place], [place] [address]  Basic metabolic panel     Status: Abnormal   Collection Time: 08/27/21  1:47 AM  Result Value Ref Range   Sodium 104 (LL) 135 - 145 mmol/L    Comment: CRITICAL RESULT CALLED TO, READ BACK BY AND VERIFIED WITH KATIE ALLRED RN AT 0254 ON 08/27/21 SNG    Potassium 5.3 (H) 3.5 - 5.1 mmol/L   Chloride 70 (L) 98 - 111 mmol/L   CO2 17 (L) 22 - 32 mmol/L   Glucose, Bld 116 (H) 70 - 99 mg/dL     Comment: Glucose reference range applies only to samples taken after fasting for at least 8 hours.   BUN 121 (H) 6 - 20 mg/dL    Comment: RESULT CONFIRMED BY MANUAL DILUTION SNG   Creatinine, Ser 9.08 (H) 0.44 - 1.00 mg/dL   Calcium 9.3 8.9 - 82.9 mg/dL   GFR, Estimated 5 (L) >60 mL/min    Comment: (NOTE) Calculated using the CKD-EPI Creatinine Equation (2021)    Anion gap 17 (H) 5 - 15    Comment: Performed at [facility], [address] [place], [place] [address]  Phosphorus     Status: Abnormal   Collection Time: 08/27/21  1:47 AM  Result Value Ref Range   Phosphorus 7.2 (H) 2.5 - 4.6 mg/dL    Comment: Performed at [facility], [address] [place], [place] [address]  CK     Status: Abnormal   Collection Time: 08/27/21  1:47 AM  Result Value Ref Range   Total CK 276 (H) 38 - 234 U/L    Comment: Performed at [facility], [address] [place], [place] [address]  TSH     Status: None   Collection Time: 08/27/21  1:47 AM  Result Value Ref Range   TSH 0.530 0.350 - 4.500 uIU/mL    Comment: Performed by a 3rd Generation assay with a functional sensitivity of <=  0.01 uIU/mL. Performed at [facility], [address] [place], [place] [address]   Osmolality     Status: Abnormal   Collection Time: 08/27/21  1:47 AM  Result Value Ref Range   Osmolality 267 (L) 275 - 295 mOsm/kg    Comment: Performed at [facility], [address] [place], [place] [address]  C-reactive protein     Status: Abnormal   Collection Time: 08/27/21  1:47 AM  Result Value Ref Range   CRP 4.3 (H) <1.0 mg/dL    Comment: Performed at [facility], [address] [place], [place] [address]  Resp Panel by RT-PCR (Flu A&B, Covid) Nasopharyngeal Swab     Status: None   Collection Time: 08/27/21  4:21 AM   Specimen: Nasopharyngeal Swab; Nasopharyngeal(NP) swabs in vial transport medium  Result Value Ref Range   SARS Coronavirus 2 by RT PCR NEGATIVE  NEGATIVE    Comment: (NOTE) SARS-CoV-2 target nucleic acids are NOT DETECTED.  The SARS-CoV-2 RNA is generally detectable in upper respiratory specimens during the acute phase of infection. The lowest concentration of SARS-CoV-2 viral copies this assay can detect is 138 copies/mL. A negative result does not preclude SARS-Cov-2 infection and should not be used as the sole basis for treatment or other patient management decisions. A negative result may occur with  improper specimen collection/handling, submission of specimen other than nasopharyngeal swab, presence of viral mutation(s) within the areas targeted by this assay, and inadequate number of viral copies(<138 copies/mL). A negative result must be combined with clinical observations, patient history, and epidemiological information. The expected result is Negative.  Fact Sheet for Patients:  BloggerCourse.com  Fact Sheet for Healthcare Providers:  SeriousBroker.it  This test is no t yet approved or cleared by the [place] FDA and  has been authorized for detection and/or diagnosis of SARS-CoV-2 by FDA under an Emergency Use Authorization (EUA). This EUA will remain  in effect (meaning this test can be used) for the duration of the COVID-19 declaration under Section 564(b)(1) of the Act, 21 U.S.C.section 360bbb-3(b)(1), unless the authorization is terminated  or revoked sooner.       Influenza A by PCR NEGATIVE NEGATIVE   Influenza B by PCR NEGATIVE NEGATIVE    Comment: (NOTE) The Xpert Xpress SARS-CoV-2/FLU/RSV plus assay is intended as an aid in the diagnosis of influenza from Nasopharyngeal swab specimens and should not be used as a sole basis for treatment. Nasal washings and aspirates are unacceptable for Xpert Xpress SARS-CoV-2/FLU/RSV testing.  Fact Sheet for Patients: BloggerCourse.com  Fact Sheet for Healthcare  Providers: SeriousBroker.it  This test is not yet approved or cleared by the [place] FDA and has been authorized for detection and/or diagnosis of SARS-CoV-2 by FDA under an Emergency Use Authorization (EUA). This EUA will remain in effect (meaning this test can be used) for the duration of the COVID-19 declaration under Section 564(b)(1) of the Act, 21 U.S.C. section 360bbb-3(b)(1), unless the authorization is terminated or revoked.  Performed at [facility], [address] [place], [place] [address]   Prealbumin     Status: None   Collection Time: 08/27/21  4:21 AM  Result Value Ref Range   Prealbumin 21.5 18 - 38 mg/dL    Comment: Performed at [facility], [address], [place], [place] [address]  HIV Antibody (routine testing w rflx)     Status: None   Collection Time: 08/27/21  4:21 AM  Result Value Ref  Range   HIV Screen 4th Generation wRfx Non Reactive Non Reactive    Comment: Performed at [facility], [address] [place], [place] [address]  Magnesium     Status: None   Collection Time: 08/27/21  4:21 AM  Result Value Ref Range   Magnesium 2.0 1.7 - 2.4 mg/dL    Comment: Performed at [facility], [address] [place], [place] [address]  Phosphorus     Status: Abnormal   Collection Time: 08/27/21  4:21 AM  Result Value Ref Range   Phosphorus 6.8 (H) 2.5 - 4.6 mg/dL    Comment: Performed at [facility], [address] [place], [place] [address]  CBC WITH DIFFERENTIAL     Status: Abnormal   Collection Time: 08/27/21  4:21 AM  Result Value Ref Range   WBC 12.2 (H) 4.0 - 10.5 K/uL   RBC 3.95 3.87 - 5.11 MIL/uL   Hemoglobin 12.6 12.0 - 15.0 g/dL   HCT 77.9 (L) 39.0 - 30.0 %   MCV 85.8 80.0 - 100.0 fL   MCH 31.9 26.0 - 34.0 pg   MCHC 37.2 (H) 30.0 - 36.0 g/dL   RDW 92.3 30.0 - 76.2 %   Platelets 700 (H) 150 - 400 K/uL   nRBC 0.0 0.0 - 0.2 %   Neutrophils Relative % 73 %   Neutro  Abs 8.8 (H) 1.7 - 7.7 K/uL   Lymphocytes Relative 19 %   Lymphs Abs 2.3 0.7 - 4.0 K/uL   Monocytes Relative 6 %   Monocytes Absolute 0.7 0.1 - 1.0 K/uL   Eosinophils Relative 0 %   Eosinophils Absolute 0.0 0.0 - 0.5 K/uL   Basophils Relative 0 %   Basophils Absolute 0.0 0.0 - 0.1 K/uL   Immature Granulocytes 2 %   Abs Immature Granulocytes 0.28 (H) 0.00 - 0.07 K/uL    Comment: Performed at [facility], [address] [place], [place] [address]  TSH     Status: None   Collection Time: 08/27/21  4:21 AM  Result Value Ref Range   TSH 0.475 0.350 - 4.500 uIU/mL    Comment: Performed by a 3rd Generation assay with a functional sensitivity of <=0.01 uIU/mL. Performed at [facility], [address] [place], [place] [address]   Comprehensive metabolic panel     Status: Abnormal   Collection Time: 08/27/21  4:21 AM  Result Value Ref Range   Sodium 106 (LL) 135 - 145 mmol/L    Comment: CRITICAL RESULT CALLED TO, READ BACK BY AND VERIFIED WITH JENNA ELLINGTON RN @0625  08/27/21 SCS    Potassium 4.8 3.5 - 5.1 mmol/L   Chloride 72 (L) 98 - 111 mmol/L   CO2 15 (L) 22 - 32 mmol/L   Glucose, Bld 112 (H) 70 - 99 mg/dL    Comment: Glucose reference range applies only to samples taken after fasting for at least 8 hours.   BUN 118 (H) 6 - 20 mg/dL    Comment: RESULT CONFIRMED BY MANUAL DILUTION SCS   Creatinine, Ser 9.15 (H) 0.44 - 1.00 mg/dL   Calcium 9.4 8.9 - 10/27/21 mg/dL   Total Protein 8.7 (H) 6.5 - 8.1 g/dL   Albumin 3.5 3.5 - 5.0 g/dL   AST 14 (L) 15 - 41 U/L   ALT 10 0 - 44 U/L   Alkaline Phosphatase 94 38 - 126 U/L   Total Bilirubin 0.8 0.3 - 1.2 mg/dL   GFR, Estimated 5 (  L) >60 mL/min    Comment: (NOTE) Calculated using the CKD-EPI Creatinine Equation (2021)    Anion gap 19 (H) 5 - 15    Comment: Performed at [facility], [address] [place], [place] [address]  Vitamin B12     Status: Abnormal   Collection Time: 08/27/21  4:21 AM  Result Value  Ref Range   Vitamin B-12 161 (L) 180 - 914 pg/mL    Comment: (NOTE) This assay is not validated for testing neonatal or myeloproliferative syndrome specimens for Vitamin B12 levels. Performed at [facility], [address] [place], [place] [address]   Folate     Status: None   Collection Time: 08/27/21  4:21 AM  Result Value Ref Range   Folate 10.5 >5.9 ng/mL    Comment: Performed at [facility], [address] [place], [place] [address]  Iron and TIBC     Status: Abnormal   Collection Time: 08/27/21  4:21 AM  Result Value Ref Range   Iron 129 28 - 170 ug/dL   TIBC 272 536 - 644 ug/dL   Saturation Ratios 51 (H) 10.4 - 31.8 %   UIBC 126 ug/dL    Comment: Performed at [facility], [address] [place], [place] [address]  Ferritin     Status: Abnormal   Collection Time: 08/27/21  4:21 AM  Result Value Ref Range   Ferritin 461 (H) 11 - 307 ng/mL    Comment: Performed at [facility], [address] [place], [place] [address]  Reticulocytes     Status: Abnormal   Collection Time: 08/27/21  4:21 AM  Result Value Ref Range   Retic Ct Pct 1.1 0.4 - 3.1 %   RBC. 3.77 (L) 3.87 - 5.11 MIL/uL   Retic Count, Absolute 43.0 19.0 - 186.0 K/uL   Immature Retic Fract 6.2 2.3 - 15.9 %    Comment: Performed at [facility], [address] [place], [place] [address]  Basic metabolic panel     Status: Abnormal   Collection Time: 08/27/21 10:54 AM  Result Value Ref Range   Sodium 107 (LL) 135 - 145 mmol/L    Comment: CRITICAL RESULT CALLED TO, READ BACK BY AND VERIFIED WITH: ANGELA ROBBINS @1259  08/27/21 MJU    Potassium 4.4 3.5 - 5.1 mmol/L   Chloride 74 (L) 98 - 111 mmol/L   CO2 17 (L) 22 - 32 mmol/L   Glucose, Bld 112 (H) 70 - 99 mg/dL    Comment: Glucose reference range applies only to samples taken after fasting for at least 8 hours.   BUN 109 (H) 6 - 20 mg/dL    Comment: RESULT CONFIRMED BY MANUAL DILUTION SCS   Creatinine, Ser 8.02  (H) 0.44 - 1.00 mg/dL   Calcium 8.4 (L) 8.9 - 10.3 mg/dL   GFR, Estimated 5 (L) >60 mL/min    Comment: (NOTE) Calculated using the CKD-EPI Creatinine Equation (2021)    Anion gap 16 (H) 5 - 15    Comment: Performed at [facility], [address] [address], [place], [place]  Basic metabolic panel     Status: Abnormal   Collection Time: 08/27/21  2:27 PM  Result Value Ref Range   Sodium 106 (LL) 135 - 145 mmol/L    Comment: CRITICAL RESULT CALLED TO, READ BACK BY AND VERIFIED WITH JENIFFER GREGORY @1537  ON 08/27/21 SKL    Potassium 3.6 3.5 - 5.1 mmol/L   Chloride 68 (  L) 98 - 111 mmol/L   CO2 19 (L) 22 - 32 mmol/L   Glucose, Bld 133 (H) 70 - 99 mg/dL    Comment: Glucose reference range applies only to samples taken after fasting for at least 8 hours.   BUN 117 (H) 6 - 20 mg/dL    Comment: RESULT CONFIRMED BY MANUAL DILUTION SKL   Creatinine, Ser 8.61 (H) 0.44 - 1.00 mg/dL   Calcium 9.1 8.9 - 16.1 mg/dL   GFR, Estimated 5 (L) >60 mL/min    Comment: (NOTE) Calculated using the CKD-EPI Creatinine Equation (2021)    Anion gap 19 (H) 5 - 15    Comment: Performed at [facility], [address] [place], [place] [address]  Sodium     Status: Abnormal   Collection Time: 08/27/21 11:28 PM  Result Value Ref Range   Sodium 105 (LL) 135 - 145 mmol/L    Comment: CRITICAL RESULT CALLED TO, READ BACK BY AND VERIFIED WITH RAMON MEZA@0001  08/28/21 Performed at [facility], [address] [place], [place] [address]   CBC     Status: Abnormal   Collection Time: 08/28/21  5:21 AM  Result Value Ref Range   WBC 9.6 4.0 - 10.5 K/uL   RBC RESULTS UNAVAILABLE DUE TO INTERFERING SUBSTANCE 3.87 - 5.11 MIL/uL   Hemoglobin 12.3 12.0 - 15.0 g/dL   HCT RESULTS UNAVAILABLE DUE TO INTERFERING SUBSTANCE 36.0 - 46.0 %   MCV RESULTS UNAVAILABLE DUE TO INTERFERING SUBSTANCE 80.0 - 100.0 fL   MCH RESULTS UNAVAILABLE DUE TO INTERFERING SUBSTANCE 26.0 - 34.0 pg   MCHC RESULTS  UNAVAILABLE DUE TO INTERFERING SUBSTANCE 30.0 - 36.0 g/dL   RDW RESULTS UNAVAILABLE DUE TO INTERFERING SUBSTANCE 11.5 - 15.5 %   Platelets 655 (H) 150 - 400 K/uL   nRBC RESULTS UNAVAILABLE DUE TO INTERFERING SUBSTANCE 0.0 - 0.2 %    Comment: Performed at [facility], [address] [place], [place] [address]  Magnesium     Status: None   Collection Time: 08/28/21  5:21 AM  Result Value Ref Range   Magnesium 1.9 1.7 - 2.4 mg/dL    Comment: Performed at [facility], [address] [place], [place] [address]  Basic metabolic panel     Status: Abnormal   Collection Time: 08/28/21  5:21 AM  Result Value Ref Range   Sodium 107 (LL) 135 - 145 mmol/L    Comment: CRITICAL RESULT CALLED TO, READ BACK BY AND VERIFIED WITH ANNA JASPER@0607  08/28/21 RH    Potassium 3.4 (L) 3.5 - 5.1 mmol/L   Chloride 66 (L) 98 - 111 mmol/L   CO2 25 22 - 32 mmol/L   Glucose, Bld 131 (H) 70 - 99 mg/dL    Comment: Glucose reference range applies only to samples taken after fasting for at least 8 hours.   BUN 102 (H) 6 - 20 mg/dL    Comment: RESULT CONFIRMED BY MANUAL DILUTION RH   Creatinine, Ser 7.24 (H) 0.44 - 1.00 mg/dL   Calcium 8.9 8.9 - 62.1 mg/dL   GFR, Estimated 6 (L) >60 mL/min    Comment: (NOTE) Calculated using the CKD-EPI Creatinine Equation (2021)    Anion gap 16 (H) 5 - 15    Comment: Performed at [facility], [address] [place], [place] [address]    Current Facility-Administered Medications  Medication Dose Route Frequency Provider Last Rate Last Admin   0.9 %  sodium chloride infusion   Intravenous  Continuous Cherylann Ratel, [place], MD 125 mL/hr at 08/28/21 1025 New Bag at 08/28/21 1025   acetaminophen (TYLENOL) tablet 650 mg  650 mg Oral Q6H PRN Therisa Doyne, MD       Or   acetaminophen (TYLENOL) suppository 650 mg  650 mg Rectal Q6H PRN Therisa Doyne, MD       escitalopram (LEXAPRO) tablet 5 mg  5 mg Oral Daily Charm Rings, NP   5 mg at  08/28/21 0946   heparin injection 5,000 Units  5,000 Units Subcutaneous Q8H Darlin Priestly, MD   5,000 Units at 08/28/21 0525   metoCLOPramide (REGLAN) injection 5 mg  5 mg Intravenous Q6H PRN Therisa Doyne, MD       Muscle Rub CREA   Topical PRN Opyd, Lavone Neri, MD       nicotine (NICODERM CQ - dosed in mg/24 hr) patch 7 mg  7 mg Transdermal Daily Doutova, Anastassia, MD       Current Outpatient Medications  Medication Sig Dispense Refill   sulfamethoxazole-trimethoprim (BACTRIM DS) 800-160 MG tablet Take 1 tablet by mouth 2 (two) times daily. For 10 days.      Musculoskeletal: Strength & Muscle Tone: within normal limits Gait & Station: unsteady Patient leans: N/A            Psychiatric Specialty Exam:  Presentation  General Appearance:  No data recorded Eye Contact: No data recorded Speech: No data recorded Speech Volume: No data recorded Handedness: No data recorded  Mood and Affect  Mood: No data recorded Affect: No data recorded  Thought Process  Thought Processes: No data recorded Descriptions of Associations:No data recorded Orientation:No data recorded Thought Content:No data recorded History of Schizophrenia/Schizoaffective disorder:No data recorded Duration of Psychotic Symptoms:No data recorded Hallucinations:No data recorded Ideas of Reference:No data recorded Suicidal Thoughts:No data recorded Homicidal Thoughts:No data recorded  Sensorium  Memory: No data recorded Judgment: No data recorded Insight: No data recorded  Executive Functions  Concentration: No data recorded Attention Span: No data recorded Recall: No data recorded Fund of Knowledge: No data recorded Language: No data recorded  Psychomotor Activity  Psychomotor Activity: No data recorded  Assets  Assets: No data recorded  Sleep  Sleep: No data recorded  Physical Exam: Physical Exam Constitutional:      Appearance: Normal appearance. She is  ill-appearing.  HENT:     Head: Normocephalic and atraumatic.     Mouth/Throat:     Pharynx: Oropharynx is clear.  Eyes:     Pupils: Pupils are equal, round, and reactive to light.  Cardiovascular:     Rate and Rhythm: Normal rate and regular rhythm.  Pulmonary:     Effort: Pulmonary effort is normal.     Breath sounds: Normal breath sounds.  Abdominal:     General: Abdomen is flat.     Palpations: Abdomen is soft.  Musculoskeletal:        General: Normal range of motion.  Skin:    General: Skin is warm and dry.  Neurological:     General: No focal deficit present.     Mental Status: Mental status is at baseline.  Psychiatric:        Attention and Perception: She is inattentive.        Mood and Affect: Mood normal. Affect is blunt.        Speech: Speech is delayed.        Behavior: Behavior is withdrawn.   Review of Systems  Constitutional:  Positive for malaise/fatigue  and weight loss.  HENT: Negative.    Eyes: Negative.   Respiratory: Negative.    Cardiovascular: Negative.   Gastrointestinal:  Positive for nausea and vomiting.  Musculoskeletal: Negative.   Skin: Negative.   Neurological: Negative.   Psychiatric/Behavioral:  Negative for depression, hallucinations, substance abuse and suicidal ideas.   Blood pressure 114/62, pulse 91, temperature 98.2 F (36.8 C), temperature source Oral, resp. rate 14, height 5\' 5"  (1.651 m), weight 47 kg, SpO2 97 %. Body mass index is 17.24 kg/m.  Treatment Plan Summary: Plan patient appears to become progressively sicker over the last week.  Apparently it was felt that it was necessary to file commitment papers to get her to the hospital.  Not clear to me whether it was because she was refusing treatment or not.  At this point the patient says she is agreeable to treatment.  Not insisting to be discharged.  She is going to be admitted to the medical service for severe hyponatremia and urinary tract infection.  Does not meet commitment  criteria.  No clear indication for specific psychiatric treatment.  Discontinue IVC.  Case reviewed with emergency room physician.  Reconsult psych if needed.  Disposition: Patient does not meet criteria for psychiatric inpatient admission.  Mordecai Rasmussen, MD 08/28/2021 11:39 AM

## 2021-08-28 NOTE — Evaluation (Addendum)
Clinical/Bedside Swallow Evaluation Patient Details  Name: Karen Mooney MRN: [id number] Date of Birth: [birth year]  Today's Date: 08/28/2021 Time: SLP Start Time (ACUTE ONLY): 1350 SLP Stop Time (ACUTE ONLY): 1430 SLP Time Calculation (min) (ACUTE ONLY): 40 min  Past Medical History: History reviewed. No pertinent past medical history. Past Surgical History:  Past Surgical History:  Procedure Laterality Date   ECTOPIC PREGNANCY SURGERY     HPI:  Pt is a 60 y.o. female with medical history significant of recurrent UTIs, tobacco abuse , weight loss.  Pt presented with  poor p.o. appetite and generalized weakness  Was seen at [facility] on 27 August for presumed UTI.  UA at the time showed persistent infection.  Family has been concerned that patient has been losing weight and not eating.  Family filed for IVC and brought patient to the hospital.  Pt reports she drinks large amounts of water and  rarely eats.  She recently has had half a bag of chips 3 days ago.  States she vomits 3 times a day.  Continues to smoke and used to drink ETOH but has not had any for past 6 m.   CXR: No active cardiopulmonary disease.  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  No acute intracranial abnormality.  CT of Abd.: Chronic mild to moderate bilateral hydronephrosis has improved since  prior study. No acute issues.   Assessment / Plan / Recommendation Clinical Impression  Pt appears to present w/ adequate oropharyngeal phase swallow function w/ No oropharyngeal phase dysphagia noted, No neuromuscular deficits noted. Eval was limitied by what pt accepted at the time, but pt consumed po trials w/ No overt, clinical s/s of aspiration during po trials. Pt appears at reduced risk for aspiration following general aspiration precautions. Pt has denied any swallowing problems to both NSG and MD(present in room initially).    During po trials, pt consumed trial consistencies w/ no overt coughing, decline in vocal quality, or change  in respiratory presentation during/post trials. Oral phase appeared [facility] w/ timely bolus management and control of bolus propulsion for A-P transfer for swallowing. Oral clearing achieved w/ all trial consistencies. Pt declined trials of solid foods w/ SLP but had attempted 2-3 bites of her Lunch meal(on table) -- she declined any difficulty, but just did not want it. OM Exam appeared [facility] w/ no unilateral weakness noted. Speech Clear. Pt fed self w/ setup support.   Recommend continue a Regular consistency diet w/ well-Cut meats, moistened foods; Thin liquids. Recommend general aspiration precautions, Pills w/ water -- tolerating well per NSG report today. Education given on food consistencies/prep and easy to eat options; general aspiration precautions. NSG to reconsult if any new needs arise. NSG agreed.  MD was asking about drink supplement for nourishment -- suggested Boost Breeze as pt does not like traditional Ensure. Recommend Dietician f/u for support.  SLP Visit Diagnosis: Dysphagia, unspecified (R13.10)    Aspiration Risk  No limitations    Diet Recommendation   Regular consistency diet w/ well-Cut meats, moistened foods; Thin liquids. Recommend general aspiration precautions. Foods of preference for encouragement.  Medication Administration: Whole meds with liquid    Other  Recommendations Recommended Consults:  (Dietician f/u) Oral Care Recommendations: Oral care BID;Oral care before and after PO;Patient independent with oral care Other Recommendations:  (n/a)   Follow up Recommendations None      Frequency and Duration  (n/a)   (n/a)       Prognosis Prognosis for Safe Diet Advancement:  Good Barriers to Reach Goals: Motivation (desire for solid foods)      Swallow Study   General Date of Onset: 08/26/21 HPI: Pt is a 60 y.o. female with medical history significant of recurrent UTIs, tobacco abuse , weight loss.  Pt presented with  poor p.o. appetite and generalized weakness   Was seen at [facility] on 27 August for presumed UTI.  UA at the time showed persistent infection.  Family has been concerned that patient has been losing weight and not eating.  Family filed for IVC and brought patient to the hospital.  Pt reports she drinks large amounts of water and  rarely eats.  She recently has had half a bag of chips 3 days ago.  States she vomits 3 times a day.  Continues to smoke and used to drink ETOH but has not had any for past 6 m.   CXR: No active cardiopulmonary disease.  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  No acute intracranial abnormality.  CT of Abd.: Chronic mild to moderate bilateral hydronephrosis has improved since  prior study. No acute issues. Type of Study: Bedside Swallow Evaluation Previous Swallow Assessment: none Diet Prior to this Study: Regular;Thin liquids Temperature Spikes Noted: No (wbc 9.6) Respiratory Status: Room air History of Recent Intubation: No Behavior/Cognition: Alert;Cooperative;Pleasant mood ("tired" -- Sodium: 107) Oral Cavity Assessment: Within Functional Limits Oral Care Completed by SLP: Recent completion by staff Oral Cavity - Dentition: Adequate natural dentition Vision: Functional for self-feeding Self-Feeding Abilities: Able to feed self;Needs set up Patient Positioning: Upright in bed (propped herself upright for po's) Baseline Vocal Quality: Normal Volitional Cough: Strong Volitional Swallow: Able to elicit    Oral/Motor/Sensory Function Overall Oral Motor/Sensory Function: Within functional limits   Ice Chips Ice chips: Not tested   Thin Liquid Thin Liquid: Within functional limits Presentation: Self Fed;Straw (~6+ ozs) Other Comments: water, juices    Nectar Thick Nectar Thick Liquid: Not tested   Honey Thick Honey Thick Liquid: Not tested   Puree Puree: Within functional limits Presentation: Spoon;Self Fed (3 trials)   Solid     Solid: Not tested Other Comments: declined        Karen Som, MS,  CCC-SLP Speech Language Pathologist Rehab Services [phone] Karen Mooney 08/28/2021,2:45 PM

## 2021-08-28 NOTE — ED Notes (Signed)
Admitting MD at bedside.

## 2021-08-28 NOTE — Progress Notes (Signed)
[facility] [place] [facility]  ROUNDING NOTE   Subjective:  Patient still being boarded down in the ED until bed available. Sodium has come up slowly and is currently 107 but overall still quite low.  BUN down to 102 with a creatinine of 7.2. Still has very low appetite.   Objective:  Vital signs in last 24 hours:  Temp:  [98.2 F (36.8 C)] 98.2 F (36.8 C) (09/04 2200) Pulse Rate:  [81-112] 91 (09/05 1000) Resp:  [10-21] 14 (09/05 1000) BP: (100-145)/(62-89) 114/62 (09/05 1000) SpO2:  [91 %-99 %] 97 % (09/05 1000)  Weight change:  Filed Weights   08/26/21 2148  Weight: 47 kg    Intake/Output: I/O last 3 completed shifts: In: 1361.9 [I.V.:1361.9] Out: -    Intake/Output this shift:  Total I/O In: 637.9 [P.O.:125; I.V.:512.9] Out: -   Physical Exam: General: No acute distress  Head: Normocephalic, atraumatic. Moist oral mucosal membranes  Eyes: Anicteric  Neck: Supple  Lungs:  Clear to auscultation, normal effort  Heart: S1S2 no rubs  Abdomen:  Soft, nontender, bowel sounds present  Extremities: No peripheral edema.  Neurologic: Awake, alert, following commands  Skin: No acute rash  Access: No dialysis access    Basic Metabolic Panel: Recent Labs  Lab 08/26/21 2302 08/26/21 2321 08/27/21 0147 08/27/21 0421 08/27/21 1054 08/27/21 1427 08/27/21 2328 08/28/21 0521  NA  --    < > 104* 106* 107* 106* 105* 107*  K  --    < > 5.3* 4.8 4.4 3.6  --  3.4*  CL  --    < > 70* 72* 74* 68*  --  66*  CO2  --    < > 17* 15* 17* 19*  --  25  GLUCOSE  --    < > 116* 112* 112* 133*  --  131*  BUN  --    < > 121* 118* 109* 117*  --  102*  CREATININE  --    < > 9.08* 9.15* 8.02* 8.61*  --  7.24*  CALCIUM  --    < > 9.3 9.4 8.4* 9.1  --  8.9  MG 2.1  --   --  2.0  --   --   --  1.9  PHOS  --   --  7.2* 6.8*  --   --   --   --    < > = values in this interval not displayed.    Liver Function Tests: Recent Labs  Lab 08/26/21 2231 08/27/21 0421  AST 14* 14*  ALT 8 10   ALKPHOS 108 94  BILITOT 0.8 0.8  PROT 9.5* 8.7*  ALBUMIN 3.9 3.5   Recent Labs  Lab 08/26/21 2231  LIPASE 33   No results for input(s): AMMONIA in the last 168 hours.  CBC: Recent Labs  Lab 08/26/21 2231 08/27/21 0421 08/28/21 0521  WBC 12.3* 12.2* 9.6  NEUTROABS 10.0* 8.8*  --   HGB 13.7 12.6 12.3  HCT 37.0 33.9* RESULTS UNAVAILABLE DUE TO INTERFERING SUBSTANCE  MCV 89.4 85.8 RESULTS UNAVAILABLE DUE TO INTERFERING SUBSTANCE  PLT 704* 700* 655*    Cardiac Enzymes: Recent Labs  Lab 08/27/21 0147  CKTOTAL 276*    BNP: Invalid input(s): POCBNP  CBG: No results for input(s): GLUCAP in the last 168 hours.  Microbiology: Results for orders placed or performed during the hospital encounter of 08/26/21  Urine Culture     Status: None   Collection Time: 08/27/21 12:06 AM  Specimen: Urine, Clean Catch  Result Value Ref Range Status   Specimen Description   Final    URINE, CLEAN CATCH Performed at [facility], [address], [place], [place] [address]    Special Requests   Final    NONE Performed at [facility], [address] [place], [place] [address]    Culture   Final    NO GROWTH Performed at [facility], [place] [address] [place], [place] [address]    Report Status 08/28/2021 FINAL  Final  Resp Panel by RT-PCR (Flu A&B, Covid) Nasopharyngeal Swab     Status: None   Collection Time: 08/27/21  4:21 AM   Specimen: Nasopharyngeal Swab; Nasopharyngeal(NP) swabs in vial transport medium  Result Value Ref Range Status   SARS Coronavirus 2 by RT PCR NEGATIVE NEGATIVE Final    Comment: (NOTE) SARS-CoV-2 target nucleic acids are NOT DETECTED.  The SARS-CoV-2 RNA is generally detectable in upper respiratory specimens during the acute phase of infection. The lowest concentration of SARS-CoV-2 viral copies this assay can detect is 138 copies/mL. A negative result does not preclude SARS-Cov-2 infection and should not be used as the  sole basis for treatment or other patient management decisions. A negative result may occur with  improper specimen collection/handling, submission of specimen other than nasopharyngeal swab, presence of viral mutation(s) within the areas targeted by this assay, and inadequate number of viral copies(<138 copies/mL). A negative result must be combined with clinical observations, patient history, and epidemiological information. The expected result is Negative.  Fact Sheet for Patients:  EntrepreneurPulse.com.au  Fact Sheet for Healthcare Providers:  IncredibleEmployment.be  This test is no t yet approved or cleared by the [place] FDA and  has been authorized for detection and/or diagnosis of SARS-CoV-2 by FDA under an Emergency Use Authorization (EUA). This EUA will remain  in effect (meaning this test can be used) for the duration of the COVID-19 declaration under Section 564(b)(1) of the Act, 21 U.S.C.section 360bbb-3(b)(1), unless the authorization is terminated  or revoked sooner.       Influenza A by PCR NEGATIVE NEGATIVE Final   Influenza B by PCR NEGATIVE NEGATIVE Final    Comment: (NOTE) The Xpert Xpress SARS-CoV-2/FLU/RSV plus assay is intended as an aid in the diagnosis of influenza from Nasopharyngeal swab specimens and should not be used as a sole basis for treatment. Nasal washings and aspirates are unacceptable for Xpert Xpress SARS-CoV-2/FLU/RSV testing.  Fact Sheet for Patients: EntrepreneurPulse.com.au  Fact Sheet for Healthcare Providers: IncredibleEmployment.be  This test is not yet approved or cleared by the [place] FDA and has been authorized for detection and/or diagnosis of SARS-CoV-2 by FDA under an Emergency Use Authorization (EUA). This EUA will remain in effect (meaning this test can be used) for the duration of the COVID-19 declaration under Section 564(b)(1) of the  Act, 21 U.S.C. section 360bbb-3(b)(1), unless the authorization is terminated or revoked.  Performed at [facility], [address] [place], [place] [address]     Coagulation Studies: Recent Labs    08/27/21 0006  LABPROT 13.5  INR 1.0    Urinalysis: Recent Labs    08/27/21 0006  COLORURINE YELLOW  LABSPEC 1.020  PHURINE 6.0  GLUCOSEU NEGATIVE  HGBUR LARGE*  BILIRUBINUR NEGATIVE  KETONESUR TRACE*  PROTEINUR >300*  NITRITE NEGATIVE  LEUKOCYTESUR LARGE*      Imaging: CT ABDOMEN PELVIS WO CONTRAST  Result Date: 08/27/2021 CLINICAL DATA:  Abdominal pain EXAM: CT  ABDOMEN AND PELVIS WITHOUT CONTRAST TECHNIQUE: Multidetector CT imaging of the abdomen and pelvis was performed following the standard protocol without IV contrast. COMPARISON:  5400 FINDINGS: Lower chest: No acute abnormality Hepatobiliary: No focal hepatic abnormality. Gallbladder unremarkable. Pancreas: No focal abnormality or ductal dilatation. Spleen: No focal abnormality.  Normal size. Adrenals/Urinary Tract: Numerous bilateral nonobstructing stones. There is mild to moderate bilateral hydronephrosis, decreased since prior study, left greater than right. Bilateral nephrolithiasis. No ureteral stones. No adrenal mass. Urinary bladder unremarkable. Stomach/Bowel: Stomach, large and small bowel grossly unremarkable. Vascular/Lymphatic: Aortic atherosclerosis. No evidence of aortic aneurysm. No adenopathy. Reproductive: Prior hysterectomy.  No adnexal masses. Other: No free fluid or free air. Musculoskeletal: No acute bony abnormality. 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 IMPRESSION: Chronic mild to moderate bilateral hydronephrosis has improved since prior study. No ureteral stones. Bilateral nephrolithiasis. Aortic atherosclerosis. No acute findings. Electronically Signed   By: Rolm Baptise M.D.   On: 08/27/2021 00:44   DG Chest 2 View  Result Date:  08/27/2021 CLINICAL DATA:  Weakness, anorexia EXAM: CHEST - 2 VIEW COMPARISON:  None. FINDINGS: The heart size and mediastinal contours are within normal limits. Both lungs are clear. The visualized skeletal structures are unremarkable. IMPRESSION: No active cardiopulmonary disease. Electronically Signed   By: Fidela Salisbury M.D.   On: 08/27/2021 00:47   CT HEAD WO CONTRAST (5MM)  Result Date: 08/27/2021 CLINICAL DATA:  Delirium EXAM: CT HEAD WITHOUT CONTRAST TECHNIQUE: Contiguous axial images were obtained from the base of the skull through the vertex without intravenous contrast. COMPARISON:  None. FINDINGS: Brain: Old left cerebellar infarct. Mild diffuse cerebral atrophy. No acute intracranial abnormality. Specifically, no hemorrhage, hydrocephalus, mass lesion, acute infarction, or significant intracranial injury. Vascular: No hyperdense vessel or unexpected calcification. Skull: No acute calvarial abnormality. Sinuses/Orbits: No acute findings Other: None IMPRESSION: Old left cerebellar infarct. No acute intracranial abnormality. Electronically Signed   By: Rolm Baptise M.D.   On: 08/27/2021 02:09     Medications:    sodium chloride 125 mL/hr at 08/28/21 1025    escitalopram  5 mg Oral Daily   heparin injection (subcutaneous)  5,000 Units Subcutaneous Q8H   nicotine  7 mg Transdermal Daily   acetaminophen **OR** acetaminophen, metoCLOPramide (REGLAN) injection, Muscle Rub  Assessment/ Plan:  60 y.o. female with past medical history of frequent urinary tract infections, tobacco abuse, prior history of proteinuria, history of mild to moderate hydronephrosis who was admitted with severe acute kidney injury and hyponatremia.  1.  Acute kidney injury/hematuria/proteinuria.  The patient has had proteinuria going back to August of last year.  Also had hematuria then as well.  We will proceed with additional work-up including SPEP, UPEP, ANA, ANCA antibodies, GBM antibodies, C3, C4.  For now continue  IV fluid hydration.  May need to consider dialysis if renal function not improving.  Unclear as to what role mild to moderate hydronephrosis playing as there is some chronicity to this.  2.  Hyponatremia.  Serum sodium coming up albeit very slowly.  Discontinue sodium bicarb and drip and switch the patient to 0.9 normal saline at 125 cc/h.  3.  Mild to moderate bilateral hydronephrosis.  Appears that this has some element of chronicity.  Most recent imaging actually showed improvement from prior imaging studies in terms of the degree of hydronephrosis.  We may need to consider urology input as well.   LOS: 1 Renee Erb 9/5/20221:29 PM

## 2021-08-28 NOTE — ED Notes (Signed)
Pt sister left to go home and has being seating with since the beginning of the shif when pt transferred to [facility] room 31

## 2021-08-28 NOTE — Progress Notes (Signed)
PROGRESS NOTE    Karen Mooney  UEA:[id number] DOB: Jun 26, 1961 DOA: 08/26/2021 PCP: Patient, No Pcp Per (Inactive)  ED25A/ED25A   Assessment & Plan:   Principal Problem:   Adjustment disorder Active Problems:   Hyponatremia   AKI (acute kidney injury) (HCC)   Hematuria   Weight loss   Malnutrition due to starvation (HCC)   Hyperkalemia   Tobacco abuse   Nausea & vomiting   Depression   Karen MusicDenise R Hostler is a 60 y.o. female with medical history significant of recurrent UTIs, tobacco abuse, weight loss whose family filed for IVC and brought pt to the hospital.  Family has been concerned that patient has been losing weight and not eating.  Was [facility] at [facility] on [facility] August for presumed UTI was given prescription for Bactrim and Pyridium UA at the time showed persistent infection.    Hyponatremia  - Na 103 on presentation.  combination of decreased PO intake, dehydration and high water intake --started on bicarb gtt, per nephro rec --Na only up to 107 after ~36 hours. Plan: --switch to NS@125  --Na check q8h   AKI  # Hematuria and proteinuria --Baseline creatinine 0.87 on 07/25/20.  ED arrival 9.08. --CT abd/pelvis show improving hydronephrosis. No exposure to contrast. Plan: --switch to NS@125  --additional workup per nephro   Mild to moderate bilateral hydronephrosis.   --appeared chronic.  Most recent imaging actually showed improvement from prior imaging studies in terms of the degree of hydronephrosis.   --may need urology consult  nausea and vomiting  --CT abdomen unremarkable except for improving hydronephrosis.   --No N/V seen after presentation. --supportive care for now    Bilateral nephrolithiasis  -CT abdomen showed Numerous bilateral nonobstructing stones.  May be contributing to hematuria. --follow up urine cx    Weight loss  -states this is secondary to decreased p.o. intake.  --nutrition consult    Hyperkalemia, resolved --resolved with IVF     Tobacco abuse --cessation advised --Nicotine patch  Compensated metabolic acidosis --anion gap, low bicarb, pCO2 low at 27.  Likely due to AKI. --improved with bicarb gtt. --treat AKI   DVT prophylaxis: Heparin SQ Code Status: Full code  Family Communication:  Level of care: Stepdown Dispo:   The patient is from: home Anticipated d/c is to: undetermined Anticipated d/c date is: undetermined Patient currently is not medically ready to d/c due to: severe hyponatremia, not eating   Subjective and Interval History:  Pt reported feeling better.  Still not eating much.  Na went up only slightly on bicarb gtt.  IVC removed by psych today.   Objective: Vitals:   08/28/21 0930 08/28/21 1000 08/28/21 1230 08/28/21 1300  BP: 107/74 114/62 136/84 126/77  Pulse: (!) 108 91    Resp: 16 14 (!) 26 15  Temp:      TempSrc:      SpO2: 99% 97%    Weight:      Height:        Intake/Output Summary (Last 24 hours) at 08/28/2021 1551 Last data filed at 08/28/2021 1017 Gross per 24 hour  Intake 1999.76 ml  Output --  Net 1999.76 ml   Filed Weights   08/26/21 2148  Weight: 47 kg    Examination:   Constitutional: NAD, AAOx3 HEENT: conjunctivae and lids normal, EOMI CV: No cyanosis.   RESP: normal respiratory effort, on RA Extremities: No effusions, edema in BLE SKIN: warm, dry Neuro: II - XII grossly intact.   Psych: depressed mood and affect.  Data Reviewed: I have personally reviewed following labs and imaging studies  CBC: Recent Labs  Lab 08/26/21 2231 08/27/21 0421 08/28/21 0521  WBC 12.3* 12.2* 9.6  NEUTROABS 10.0* 8.8*  --   HGB 13.7 12.6 12.3  HCT 37.0 33.9* RESULTS UNAVAILABLE DUE TO INTERFERING SUBSTANCE  MCV 89.4 85.8 RESULTS UNAVAILABLE DUE TO INTERFERING SUBSTANCE  PLT 704* 700* 655*   Basic Metabolic Panel: Recent Labs  Lab 08/26/21 2302 08/26/21 2321 08/27/21 0147 08/27/21 0421 08/27/21 1054 08/27/21 1427 08/27/21 2328 08/28/21 0521  NA  --     < > 104* 106* 107* 106* 105* 107*  K  --    < > 5.3* 4.8 4.4 3.6  --  3.4*  CL  --    < > 70* 72* 74* 68*  --  66*  CO2  --    < > 17* 15* 17* 19*  --  25  GLUCOSE  --    < > 116* 112* 112* 133*  --  131*  BUN  --    < > 121* 118* 109* 117*  --  102*  CREATININE  --    < > 9.08* 9.15* 8.02* 8.61*  --  7.24*  CALCIUM  --    < > 9.3 9.4 8.4* 9.1  --  8.9  MG 2.1  --   --  2.0  --   --   --  1.9  PHOS  --   --  7.2* 6.8*  --   --   --   --    < > = values in this interval not displayed.   GFR: Estimated Creatinine Clearance: 6.1 mL/min (A) (by C-G formula based on SCr of 7.24 mg/dL (H)). Liver Function Tests: Recent Labs  Lab 08/26/21 2231 08/27/21 0421  AST 14* 14*  ALT 8 10  ALKPHOS 108 94  BILITOT 0.8 0.8  PROT 9.5* 8.7*  ALBUMIN 3.9 3.5   Recent Labs  Lab 08/26/21 2231  LIPASE 33   No results for input(s): AMMONIA in the last 168 hours. Coagulation Profile: Recent Labs  Lab 08/27/21 0006  INR 1.0   Cardiac Enzymes: Recent Labs  Lab 08/27/21 0147  CKTOTAL 276*   BNP (last 3 results) No results for input(s): PROBNP in the last 8760 hours. HbA1C: No results for input(s): HGBA1C in the last 72 hours. CBG: No results for input(s): GLUCAP in the last 168 hours. Lipid Profile: No results for input(s): CHOL, HDL, LDLCALC, TRIG, CHOLHDL, LDLDIRECT in the last 72 hours. Thyroid Function Tests: Recent Labs    08/27/21 0421  TSH 0.475   Anemia Panel: Recent Labs    08/27/21 0421  VITAMINB12 161*  FOLATE 10.5  FERRITIN 461*  TIBC 255  IRON 129  RETICCTPCT 1.1   Sepsis Labs: Recent Labs  Lab 08/27/21 0006  LATICACIDVEN 0.9    Recent Results (from the past 240 hour(s))  Urine Culture     Status: None   Collection Time: 08/27/21 12:06 AM   Specimen: Urine, Clean Catch  Result Value Ref Range Status   Specimen Description   Final    URINE, CLEAN CATCH Performed at [facility], [address] [place], [place] [address]    Special  Requests   Final    NONE Performed at [facility], [address] [place], [place] [address]    Culture   Final    NO GROWTH Performed at [facility], [address], [place],  [place] [address]    Report Status 08/28/2021 FINAL  Final  Resp Panel by RT-PCR (Flu A&B, Covid) Nasopharyngeal Swab     Status: None   Collection Time: 08/27/21  4:21 AM   Specimen: Nasopharyngeal Swab; Nasopharyngeal(NP) swabs in vial transport medium  Result Value Ref Range Status   SARS Coronavirus 2 by RT PCR NEGATIVE NEGATIVE Final    Comment: (NOTE) SARS-CoV-2 target nucleic acids are NOT DETECTED.  The SARS-CoV-2 RNA is generally detectable in upper respiratory specimens during the acute phase of infection. The lowest concentration of SARS-CoV-2 viral copies this assay can detect is 138 copies/mL. A negative result does not preclude SARS-Cov-2 infection and should not be used as the sole basis for treatment or other patient management decisions. A negative result may occur with  improper specimen collection/handling, submission of specimen other than nasopharyngeal swab, presence of viral mutation(s) within the areas targeted by this assay, and inadequate number of viral copies(<138 copies/mL). A negative result must be combined with clinical observations, patient history, and epidemiological information. The expected result is Negative.  Fact Sheet for Patients:  BloggerCourse.com  Fact Sheet for Healthcare Providers:  SeriousBroker.it  This test is no t yet approved or cleared by the [place] FDA and  has been authorized for detection and/or diagnosis of SARS-CoV-2 by FDA under an Emergency Use Authorization (EUA). This EUA will remain  in effect (meaning this test can be used) for the duration of the COVID-19 declaration under Section 564(b)(1) of the Act, 21 U.S.C.section 360bbb-3(b)(1), unless the authorization is  terminated  or revoked sooner.       Influenza A by PCR NEGATIVE NEGATIVE Final   Influenza B by PCR NEGATIVE NEGATIVE Final    Comment: (NOTE) The Xpert Xpress SARS-CoV-2/FLU/RSV plus assay is intended as an aid in the diagnosis of influenza from Nasopharyngeal swab specimens and should not be used as a sole basis for treatment. Nasal washings and aspirates are unacceptable for Xpert Xpress SARS-CoV-2/FLU/RSV testing.  Fact Sheet for Patients: BloggerCourse.com  Fact Sheet for Healthcare Providers: SeriousBroker.it  This test is not yet approved or cleared by the [place] FDA and has been authorized for detection and/or diagnosis of SARS-CoV-2 by FDA under an Emergency Use Authorization (EUA). This EUA will remain in effect (meaning this test can be used) for the duration of the COVID-19 declaration under Section 564(b)(1) of the Act, 21 U.S.C. section 360bbb-3(b)(1), unless the authorization is terminated or revoked.  Performed at [facility], [address] [place], [place] [address]       Radiology Studies: CT ABDOMEN PELVIS WO CONTRAST  Result Date: 08/27/2021 CLINICAL DATA:  Abdominal pain EXAM: CT ABDOMEN AND PELVIS WITHOUT CONTRAST TECHNIQUE: Multidetector CT imaging of the abdomen and pelvis was performed following the standard protocol without IV contrast. COMPARISON:  [id number] FINDINGS: Lower chest: No acute abnormality Hepatobiliary: No focal hepatic abnormality. Gallbladder unremarkable. Pancreas: No focal abnormality or ductal dilatation. Spleen: No focal abnormality.  Normal size. Adrenals/Urinary Tract: Numerous bilateral nonobstructing stones. There is mild to moderate bilateral hydronephrosis, decreased since prior study, left greater than right. Bilateral nephrolithiasis. No ureteral stones. No adrenal mass. Urinary bladder unremarkable. Stomach/Bowel: Stomach, large and small bowel grossly  unremarkable. Vascular/Lymphatic: Aortic atherosclerosis. No evidence of aortic aneurysm. No adenopathy. Reproductive: Prior hysterectomy.  No adnexal masses. Other: No free fluid or free air. Musculoskeletal: No acute bony abnormality. 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 IMPRESSION: Chronic mild to  moderate bilateral hydronephrosis has improved since prior study. No ureteral stones. Bilateral nephrolithiasis. Aortic atherosclerosis. No acute findings. Electronically Signed   By: Charlett Nose M.D.   On: 08/27/2021 00:44   DG Chest 2 View  Result Date: 08/27/2021 CLINICAL DATA:  Weakness, anorexia EXAM: CHEST - 2 VIEW COMPARISON:  None. FINDINGS: The heart size and mediastinal contours are within normal limits. Both lungs are clear. The visualized skeletal structures are unremarkable. IMPRESSION: No active cardiopulmonary disease. Electronically Signed   By: Helyn Numbers M.D.   On: 08/27/2021 00:47   CT HEAD WO CONTRAST ( )  Result Date: 08/27/2021 CLINICAL DATA:  Delirium EXAM: CT HEAD WITHOUT CONTRAST TECHNIQUE: Contiguous axial images were obtained from the base of the skull through the vertex without intravenous contrast. COMPARISON:  None. FINDINGS: Brain: Old left cerebellar infarct. Mild diffuse cerebral atrophy. No acute intracranial abnormality. Specifically, no hemorrhage, hydrocephalus, mass lesion, acute infarction, or significant intracranial injury. Vascular: No hyperdense vessel or unexpected calcification. Skull: No acute calvarial abnormality. Sinuses/Orbits: No acute findings Other: None IMPRESSION: Old left cerebellar infarct. No acute intracranial abnormality. Electronically Signed   By: Charlett Nose M.D.   On: 08/27/2021 02:09     Scheduled Meds:  escitalopram  5 mg Oral Daily   heparin injection (subcutaneous)  5,000 Units Subcutaneous Q8H   nicotine  7 mg Transdermal Daily   Continuous Infusions:  sodium chloride 125  mL/hr at 08/28/21 1025     LOS: 1 day     Darlin Priestly, MD [facility] If 7PM-7AM, please contact night-coverage 08/28/2021, 3:51 PM

## 2021-08-28 NOTE — ED Notes (Signed)
IVC/pending medical admission ?

## 2021-08-29 ENCOUNTER — Other Ambulatory Visit: Payer: Self-pay

## 2021-08-29 LAB — BASIC METABOLIC PANEL
Anion gap: 16 — ABNORMAL HIGH (ref 5–15)
BUN: 90 mg/dL — ABNORMAL HIGH (ref 6–20)
CO2: 25 mmol/L (ref 22–32)
Calcium: 9.2 mg/dL (ref 8.9–10.3)
Chloride: 72 mmol/L — ABNORMAL LOW (ref 98–111)
Creatinine, Ser: 3.56 mg/dL — ABNORMAL HIGH (ref 0.44–1.00)
GFR, Estimated: 14 mL/min — ABNORMAL LOW (ref >60)
Glucose, Bld: 97 mg/dL (ref 70–99)
Potassium: 3.3 mmol/L — ABNORMAL LOW (ref 3.5–5.1)
Sodium: 113 mmol/L — CL (ref 135–145)

## 2021-08-29 LAB — CBC
HCT: 31.7 % — ABNORMAL LOW (ref 36.0–46.0)
Hemoglobin: 11.8 g/dL — ABNORMAL LOW (ref 12.0–15.0)
MCH: 32.3 pg (ref 26.0–34.0)
MCHC: 37.2 g/dL — ABNORMAL HIGH (ref 30.0–36.0)
MCV: 86.8 fL (ref 80.0–100.0)
Platelets: 642 10*3/uL — ABNORMAL HIGH (ref 150–400)
RBC: 3.65 MIL/uL — ABNORMAL LOW (ref 3.87–5.11)
RDW: 11.4 % — ABNORMAL LOW (ref 11.5–15.5)
WBC: 6.9 10*3/uL (ref 4.0–10.5)
nRBC: 0 % (ref 0.0–0.2)

## 2021-08-29 LAB — PHOSPHORUS: Phosphorus: 3.5 mg/dL (ref 2.5–4.6)

## 2021-08-29 LAB — C3 COMPLEMENT: C3 Complement: 137 mg/dL (ref 82–167)

## 2021-08-29 LAB — GLOMERULAR BASEMENT MEMBRANE ANTIBODIES: GBM Ab: <0.2 units (ref 0.0–0.9)

## 2021-08-29 LAB — MAGNESIUM: Magnesium: 1.8 mg/dL (ref 1.7–2.4)

## 2021-08-29 LAB — SODIUM
Sodium: 115 mmol/L — CL (ref 135–145)
Sodium: 117 mmol/L — CL (ref 135–145)

## 2021-08-29 LAB — PARATHYROID HORMONE, INTACT (NO CA): PTH: 129 pg/mL — ABNORMAL HIGH (ref 15–65)

## 2021-08-29 LAB — C4 COMPLEMENT: Complement C4, Body Fluid: 46 mg/dL — ABNORMAL HIGH (ref 12–38)

## 2021-08-29 MED ORDER — POTASSIUM CHLORIDE CRYS ER 20 MEQ PO TBCR
40.0000 meq | EXTENDED_RELEASE_TABLET | Freq: Once | ORAL | Status: AC
  Administered 2021-08-29: 40 meq via ORAL
  Filled 2021-08-29: qty 4

## 2021-08-29 NOTE — Progress Notes (Signed)
PROGRESS NOTE    Karen Mooney  GYF:[id number] DOB: Nov 07, 1961 DOA: 08/26/2021 PCP: Patient, No Pcp Per (Inactive)  ED35A/ED35A   Assessment & Plan:   Principal Problem:   Adjustment disorder Active Problems:   Hyponatremia   AKI (acute kidney injury) (HCC)   Hematuria   Weight loss   Malnutrition due to starvation (HCC)   Hyperkalemia   Tobacco abuse   Nausea & vomiting   Depression   Karen Mooney is a 60 y.o. female with medical history significant of recurrent UTIs, tobacco abuse, weight loss whose family filed for IVC and brought pt to the hospital.  Family has been concerned that patient has been losing weight and not eating.  Was seen at [facility] on 27 August for presumed UTI was given prescription for Bactrim and Pyridium UA at the time showed persistent infection.    Hyponatremia  - Na 103 on presentation.  combination of decreased PO intake, dehydration and high water intake --started on bicarb gtt, per nephro rec --Na only up to 107 after ~36 hours, then infusion switched to NS. --Na up to 115 this morning. Plan: --cont NS@125 , per nephro --Na check q8h --If >8 point increase in Na after 2r hours, hold NS infusion.   AKI  # Hematuria and proteinuria --Baseline creatinine 0.87 on 07/25/20.  On arrival Cr 9.08. --CT abd/pelvis show improving hydronephrosis. No exposure to contrast. --Cr improving with IVF Plan: --cont NS@125 , per nephro --additional workup per nephro   Mild to moderate bilateral hydronephrosis.   --appeared chronic.  Most recent imaging actually showed improvement from prior imaging studies in terms of the degree of hydronephrosis.   --may need urology consult  nausea and vomiting  --CT abdomen unremarkable except for improving hydronephrosis.   --No N/V seen after presentation. --supportive care for now    Bilateral nephrolithiasis  -CT abdomen showed Numerous bilateral nonobstructing stones.  May be contributing to hematuria.     Weight loss  -states this is secondary to decreased p.o. intake.  --Nutrition consult    Hyperkalemia, resolved --resolved with IVF  Hypokalemia --Monitor and replete with oral potassium    Tobacco abuse --cessation advised --Nicotine patch  Compensated metabolic acidosis --anion gap, low bicarb, pCO2 low at 27.  Likely due to AKI. --improved with bicarb gtt. --treat AKI   DVT prophylaxis: Heparin SQ Code Status: Full code  Family Communication:  Level of care: Progressive Cardiac Dispo:   The patient is from: home Anticipated d/c is to: undetermined Anticipated d/c date is: undetermined Patient currently is not medically ready to d/c due to: severe hyponatremia, not eating   Subjective and Interval History:  NS infusion was stopped last night for unknown reason.  NS infusion resumed this morning with improvement in Na level.  Pt reported feeling better.  No dyspnea.  Some burning with urination.     Objective: Vitals:   08/29/21 1030 08/29/21 1130 08/29/21 1320 08/29/21 1330  BP: (!) 111/55 (!) 105/52 119/64 123/71  Pulse: (!) 101 90 95 98  Resp: 14 15 14 13   Temp:      TempSrc:      SpO2: 93% 95% 96% 97%  Weight:      Height:        Intake/Output Summary (Last 24 hours) at 08/29/2021 1535 Last data filed at 08/29/2021 1000 Gross per 24 hour  Intake 1150 ml  Output 350 ml  Net 800 ml   Filed Weights   08/26/21 2148  Weight: 47 kg  Examination:   Constitutional: NAD, AAOx3 HEENT: conjunctivae and lids normal, EOMI CV: No cyanosis.   RESP: normal respiratory effort, on RA Extremities: No effusions, edema in BLE SKIN: warm, dry Neuro: II - XII grossly intact.   Psych: depressed mood and affect.     Data Reviewed: I have personally reviewed following labs and imaging studies  CBC: Recent Labs  Lab 08/26/21 2231 08/27/21 0421 08/28/21 0521 08/29/21 0717  WBC 12.3* 12.2* 9.6 6.9  NEUTROABS 10.0* 8.8*  --   --   HGB 13.7 12.6 12.3 11.8*   HCT 37.0 33.9* RESULTS UNAVAILABLE DUE TO INTERFERING SUBSTANCE 31.7*  MCV 89.4 85.8 RESULTS UNAVAILABLE DUE TO INTERFERING SUBSTANCE 86.8  PLT 704* 700* 655* 642*   Basic Metabolic Panel: Recent Labs  Lab 08/26/21 2302 08/26/21 2321 08/27/21 0147 08/27/21 0421 08/27/21 1054 08/27/21 1427 08/27/21 2328 08/28/21 0521 08/28/21 1627 08/29/21 0717 08/29/21 1417  NA  --    < > 104* 106* 107* 106* 105* 107* 112* 113* 115*  K  --    < > 5.3* 4.8 4.4 3.6  --  3.4*  --  3.3*  --   CL  --    < > 70* 72* 74* 68*  --  66*  --  72*  --   CO2  --    < > 17* 15* 17* 19*  --  25  --  25  --   GLUCOSE  --    < > 116* 112* 112* 133*  --  131*  --  97  --   BUN  --    < > 121* 118* 109* 117*  --  102*  --  90*  --   CREATININE  --    < > 9.08* 9.15* 8.02* 8.61*  --  7.24*  --  3.56*  --   CALCIUM  --    < > 9.3 9.4 8.4* 9.1  --  8.9  --  9.2  --   MG 2.1  --   --  2.0  --   --   --  1.9  --  1.8  --   PHOS  --   --  7.2* 6.8*  --   --   --   --   --  3.5  --    < > = values in this interval not displayed.   GFR: Estimated Creatinine Clearance: 12.5 mL/min (A) (by C-G formula based on SCr of 3.56 mg/dL (H)). Liver Function Tests: Recent Labs  Lab 08/26/21 2231 08/27/21 0421  AST 14* 14*  ALT 8 10  ALKPHOS 108 94  BILITOT 0.8 0.8  PROT 9.5* 8.7*  ALBUMIN 3.9 3.5   Recent Labs  Lab 08/26/21 2231  LIPASE 33   No results for input(s): AMMONIA in the last 168 hours. Coagulation Profile: Recent Labs  Lab 08/27/21 0006  INR 1.0   Cardiac Enzymes: Recent Labs  Lab 08/27/21 0147  CKTOTAL 276*   BNP (last 3 results) No results for input(s): PROBNP in the last 8760 hours. HbA1C: No results for input(s): HGBA1C in the last 72 hours. CBG: No results for input(s): GLUCAP in the last 168 hours. Lipid Profile: No results for input(s): CHOL, HDL, LDLCALC, TRIG, CHOLHDL, LDLDIRECT in the last 72 hours. Thyroid Function Tests: Recent Labs    08/27/21 0421  TSH 0.475   Anemia  Panel: Recent Labs    08/27/21 0421  VITAMINB12 161*  FOLATE 10.5  FERRITIN 461*  TIBC 255  IRON 129  RETICCTPCT 1.1   Sepsis Labs: Recent Labs  Lab 08/27/21 0006  LATICACIDVEN 0.9    Recent Results (from the past 240 hour(s))  Urine Culture     Status: None   Collection Time: 08/27/21 12:06 AM   Specimen: Urine, Clean Catch  Result Value Ref Range Status   Specimen Description   Final    URINE, CLEAN CATCH Performed at [facility], [address] [place], [place] [address]    Special Requests   Final    NONE Performed at [facility], [address] [place], [place] [address]    Culture   Final    NO GROWTH Performed at [facility], [address] [place], [place] [address]    Report Status 08/28/2021 FINAL  Final  Resp Panel by RT-PCR (Flu A&B, Covid) Nasopharyngeal Swab     Status: None   Collection Time: 08/27/21  4:21 AM   Specimen: Nasopharyngeal Swab; Nasopharyngeal(NP) swabs in vial transport medium  Result Value Ref Range Status   SARS Coronavirus 2 by RT PCR NEGATIVE NEGATIVE Final    Comment: (NOTE) SARS-CoV-2 target nucleic acids are NOT DETECTED.  The SARS-CoV-2 RNA is generally detectable in upper respiratory specimens during the acute phase of infection. The lowest concentration of SARS-CoV-2 viral copies this assay can detect is 138 copies/mL. A negative result does not preclude SARS-Cov-2 infection and should not be used as the sole basis for treatment or other patient management decisions. A negative result may occur with  improper specimen collection/handling, submission of specimen other than nasopharyngeal swab, presence of viral mutation(s) within the areas targeted by this assay, and inadequate number of viral copies(<138 copies/mL). A negative result must be combined with clinical observations, patient history, and epidemiological information. The expected result is Negative.  Fact Sheet for Patients:   BloggerCourse.com  Fact Sheet for Healthcare Providers:  SeriousBroker.it  This test is no t yet approved or cleared by the [place] FDA and  has been authorized for detection and/or diagnosis of SARS-CoV-2 by FDA under an Emergency Use Authorization (EUA). This EUA will remain  in effect (meaning this test can be used) for the duration of the COVID-19 declaration under Section 564(b)(1) of the Act, 21 U.S.C.section 360bbb-3(b)(1), unless the authorization is terminated  or revoked sooner.       Influenza A by PCR NEGATIVE NEGATIVE Final   Influenza B by PCR NEGATIVE NEGATIVE Final    Comment: (NOTE) The Xpert Xpress SARS-CoV-2/FLU/RSV plus assay is intended as an aid in the diagnosis of influenza from Nasopharyngeal swab specimens and should not be used as a sole basis for treatment. Nasal washings and aspirates are unacceptable for Xpert Xpress SARS-CoV-2/FLU/RSV testing.  Fact Sheet for Patients: BloggerCourse.com  Fact Sheet for Healthcare Providers: SeriousBroker.it  This test is not yet approved or cleared by the [place] FDA and has been authorized for detection and/or diagnosis of SARS-CoV-2 by FDA under an Emergency Use Authorization (EUA). This EUA will remain in effect (meaning this test can be used) for the duration of the COVID-19 declaration under Section 564(b)(1) of the Act, 21 U.S.C. section 360bbb-3(b)(1), unless the authorization is terminated or revoked.  Performed at [facility], [address] [place], [place] [address]       Radiology Studies: No results found.   Scheduled Meds:  escitalopram  5 mg Oral Daily   heparin injection (subcutaneous)  5,000 Units Subcutaneous Q8H  nicotine  7 mg Transdermal Daily   Continuous Infusions:  sodium chloride 125 mL/hr at 08/29/21 0729     LOS: 2 days     Darlin Priestlyina Lavonte Palos, MD [facility] If 7PM-7AM, please contact night-coverage 08/29/2021, 3:35 PM

## 2021-08-29 NOTE — Progress Notes (Signed)
[facility]  ROUNDING NOTE   Subjective:  Patient still being boarded down in the ED until bed available.  Sodium slowly correcting, 112 this am States she was able to eat small amount of breakfast Denies shortness of breath  Objective:  Vital signs in last 24 hours:  Temp:  [98.1 F (36.7 C)-98.3 F (36.8 C)] 98.1 F (36.7 C) (09/06 0500) Pulse Rate:  [76-103] 98 (09/06 1330) Resp:  [10-21] 13 (09/06 1330) BP: (98-135)/(52-77) 123/71 (09/06 1330) SpO2:  [91 %-100 %] 97 % (09/06 1330)  Weight change:  Filed Weights   08/26/21 2148  Weight: 47 kg    Intake/Output: I/O last 3 completed shifts: In: 2999.8 [P.O.:125; I.V.:2874.8] Out: 350 [Urine:350]   Intake/Output this shift:  Total I/O In: 150 [P.O.:150] Out: -   Physical Exam: General: No acute distress  Head: Normocephalic, atraumatic. Moist oral mucosal membranes  Eyes: Anicteric  Lungs:  Clear to auscultation, normal effort  Heart: S1S2 no rubs  Abdomen:  Soft, nontender, bowel sounds present  Extremities: No peripheral edema.  Neurologic: Awake, alert, following commands  Skin: No acute rash       Basic Metabolic Panel: Recent Labs  Lab 08/26/21 2302 08/26/21 2321 08/27/21 0147 08/27/21 0421 08/27/21 1054 08/27/21 1427 08/27/21 2328 08/28/21 0521 08/28/21 1627 08/29/21 0717 08/29/21 1417  NA  --    < > 104* 106* 107* 106* 105* 107* 112* 113* 115*  K  --    < > 5.3* 4.8 4.4 3.6  --  3.4*  --  3.3*  --   CL  --    < > 70* 72* 74* 68*  --  66*  --  72*  --   CO2  --    < > 17* 15* 17* 19*  --  25  --  25  --   GLUCOSE  --    < > 116* 112* 112* 133*  --  131*  --  97  --   BUN  --    < > 121* 118* 109* 117*  --  102*  --  90*  --   CREATININE  --    < > 9.08* 9.15* 8.02* 8.61*  --  7.24*  --  3.56*  --   CALCIUM  --    < > 9.3 9.4 8.4* 9.1  --  8.9  --  9.2  --   MG 2.1  --   --  2.0  --   --   --  1.9  --  1.8  --   PHOS  --   --  7.2* 6.8*  --   --   --   --   --  3.5  --    <  > = values in this interval not displayed.     Liver Function Tests: Recent Labs  Lab 08/26/21 2231 08/27/21 0421  AST 14* 14*  ALT 8 10  ALKPHOS 108 94  BILITOT 0.8 0.8  PROT 9.5* 8.7*  ALBUMIN 3.9 3.5    Recent Labs  Lab 08/26/21 2231  LIPASE 33    No results for input(s): AMMONIA in the last 168 hours.  CBC: Recent Labs  Lab 08/26/21 2231 08/27/21 0421 08/28/21 0521 08/29/21 0717  WBC 12.3* 12.2* 9.6 6.9  NEUTROABS 10.0* 8.8*  --   --   HGB 13.7 12.6 12.3 11.8*  HCT 37.0 33.9* RESULTS UNAVAILABLE DUE TO INTERFERING SUBSTANCE 31.7*  MCV 89.4 85.8 RESULTS UNAVAILABLE DUE  TO INTERFERING SUBSTANCE 86.8  PLT 704* 700* 655* 642*     Cardiac Enzymes: Recent Labs  Lab 08/27/21 0147  CKTOTAL 276*     BNP: Invalid input(s): POCBNP  CBG: No results for input(s): GLUCAP in the last 168 hours.  Microbiology: Results for orders placed or performed during the hospital encounter of 08/26/21  Urine Culture     Status: None   Collection Time: 08/27/21 12:06 AM   Specimen: Urine, Clean Catch  Result Value Ref Range Status   Specimen Description   Final    URINE, CLEAN CATCH Performed at [facility], [address], [place], [place] [address]    Special Requests   Final    NONE Performed at [facility], [address], [place], [place] [address]    Culture   Final    NO GROWTH Performed at [facility], [place] [address], [place], [place] [address]    Report Status 08/28/2021 FINAL  Final  Resp Panel by RT-PCR (Flu A&B, Covid) Nasopharyngeal Swab     Status: None   Collection Time: 08/27/21  4:21 AM   Specimen: Nasopharyngeal Swab; Nasopharyngeal(NP) swabs in vial transport medium  Result Value Ref Range Status   SARS Coronavirus 2 by RT PCR NEGATIVE NEGATIVE Final    Comment: (NOTE) SARS-CoV-2 target nucleic acids are NOT DETECTED.  The SARS-CoV-2 RNA is generally detectable in upper respiratory specimens during the acute  phase of infection. The lowest concentration of SARS-CoV-2 viral copies this assay can detect is 138 copies/mL. A negative result does not preclude SARS-Cov-2 infection and should not be used as the sole basis for treatment or other patient management decisions. A negative result may occur with  improper specimen collection/handling, submission of specimen other than nasopharyngeal swab, presence of viral mutation(s) within the areas targeted by this assay, and inadequate number of viral copies(<138 copies/mL). A negative result must be combined with clinical observations, patient history, and epidemiological information. The expected result is Negative.  Fact Sheet for Patients:  EntrepreneurPulse.com.au  Fact Sheet for Healthcare Providers:  IncredibleEmployment.be  This test is no t yet approved or cleared by the [place] FDA and  has been authorized for detection and/or diagnosis of SARS-CoV-2 by FDA under an Emergency Use Authorization (EUA). This EUA will remain  in effect (meaning this test can be used) for the duration of the COVID-19 declaration under Section 564(b)(1) of the Act, 21 U.S.C.section 360bbb-3(b)(1), unless the authorization is terminated  or revoked sooner.       Influenza A by PCR NEGATIVE NEGATIVE Final   Influenza B by PCR NEGATIVE NEGATIVE Final    Comment: (NOTE) The Xpert Xpress SARS-CoV-2/FLU/RSV plus assay is intended as an aid in the diagnosis of influenza from Nasopharyngeal swab specimens and should not be used as a sole basis for treatment. Nasal washings and aspirates are unacceptable for Xpert Xpress SARS-CoV-2/FLU/RSV testing.  Fact Sheet for Patients: EntrepreneurPulse.com.au  Fact Sheet for Healthcare Providers: IncredibleEmployment.be  This test is not yet approved or cleared by the [place] FDA and has been authorized for detection and/or diagnosis of  SARS-CoV-2 by FDA under an Emergency Use Authorization (EUA). This EUA will remain in effect (meaning this test can be used) for the duration of the COVID-19 declaration under Section 564(b)(1) of the Act, 21 U.S.C. section 360bbb-3(b)(1), unless the authorization is terminated or revoked.  Performed at [facility], [address], [place], [place] [address]     Coagulation  Studies: Recent Labs    08/27/21 0006  LABPROT 13.5  INR 1.0     Urinalysis: Recent Labs    08/27/21 0006  COLORURINE YELLOW  LABSPEC 1.020  PHURINE 6.0  GLUCOSEU NEGATIVE  HGBUR LARGE*  BILIRUBINUR NEGATIVE  KETONESUR TRACE*  PROTEINUR >300*  NITRITE NEGATIVE  LEUKOCYTESUR LARGE*       Imaging: No results found.   Medications:    sodium chloride 125 mL/hr at 08/29/21 0729    escitalopram  5 mg Oral Daily   heparin injection (subcutaneous)  5,000 Units Subcutaneous Q8H   nicotine  7 mg Transdermal Daily   acetaminophen **OR** acetaminophen, metoCLOPramide (REGLAN) injection, Muscle Rub  Assessment/ Plan:  60 y.o. female with past medical history of frequent urinary tract infections, tobacco abuse, prior history of proteinuria, history of mild to moderate hydronephrosis who was admitted with severe acute kidney injury and hyponatremia.  1.  Acute kidney injury/hematuria/proteinuria.  The patient has had proteinuria going back to August of last year.  Also had hematuria then as well.  We will proceed with additional work-up including SPEP, ANA, ANCA antibodies, GBM antibodies. Continue IV fluid hydration with NS.  May need to consider dialysis if renal function not improving.  Unclear as to what role mild to moderate hydronephrosis playing as there is some chronicity to this. Creatinine improved overnight.  Elevated C4 and UPEP, awaiting other labs to result  2.  Hyponatremia.  Serum sodium coming up albeit very slowly.  Continue 0.9 normal saline at 125 cc/h.  3.  Mild to  moderate bilateral hydronephrosis.  Appears that this has some element of chronicity.  Most recent imaging actually showed improvement from prior imaging studies in terms of the degree of hydronephrosis.  Would consider urology input as well.   LOS: 2 Audray Rumore 9/6/20223:19 PM

## 2021-08-29 NOTE — Progress Notes (Signed)
OT Cancellation Note  Patient Details Name: Karen Mooney MRN: [id number] DOB: 05-09-1961   Cancelled Treatment:    Reason Eval/Treat Not Completed: Medical issues which prohibited therapy. Per chart review, pt continues to have critically low Na at 115, which is contraindicated for therapy at this time. Will continue to follow and initiate services as pt medically appropriate to participate in therapy.    Latina Craver, PhD, MS, OTR/L 08/29/21, 3:21 PM

## 2021-08-29 NOTE — Progress Notes (Signed)
PT Cancellation Note  Patient Details Name: Karen Mooney MRN: [id number] DOB: 1961/06/13   Cancelled Treatment:    Reason Eval/Treat Not Completed: Medical issues which prohibited therapy Pt continues to have hyponatremia outside of recommended levels to work with PT.  Will maintain on caseload and follow from a distance.    Malachi Pro, DPT 08/29/2021, 5:10 PM

## 2021-08-29 NOTE — ED Notes (Signed)
Maggie RN aware of assigned bed 

## 2021-08-29 NOTE — Progress Notes (Signed)
Patients brother brought outside food pt able to eat large burger

## 2021-08-30 DIAGNOSIS — E43 Unspecified severe protein-calorie malnutrition: Secondary | ICD-10-CM | POA: Insufficient documentation

## 2021-08-30 LAB — CBC
HCT: 28 % — ABNORMAL LOW (ref 36.0–46.0)
Hemoglobin: 10.2 g/dL — ABNORMAL LOW (ref 12.0–15.0)
MCH: 33 pg (ref 26.0–34.0)
MCHC: 36.4 g/dL — ABNORMAL HIGH (ref 30.0–36.0)
MCV: 90.6 fL (ref 80.0–100.0)
Platelets: 575 10*3/uL — ABNORMAL HIGH (ref 150–400)
RBC: 3.09 MIL/uL — ABNORMAL LOW (ref 3.87–5.11)
RDW: 11.9 % (ref 11.5–15.5)
WBC: 8.3 10*3/uL (ref 4.0–10.5)
nRBC: 0 % (ref 0.0–0.2)

## 2021-08-30 LAB — SODIUM
Sodium: 123 mmol/L — ABNORMAL LOW (ref 135–145)
Sodium: 126 mmol/L — ABNORMAL LOW (ref 135–145)

## 2021-08-30 LAB — PROTEIN ELECTROPHORESIS, SERUM
A/G Ratio: 0.7 (ref 0.7–1.7)
Albumin ELP: 3.2 g/dL (ref 2.9–4.4)
Alpha-1-Globulin: 0.4 g/dL (ref 0.0–0.4)
Alpha-2-Globulin: 1.5 g/dL — ABNORMAL HIGH (ref 0.4–1.0)
Beta Globulin: 1.1 g/dL (ref 0.7–1.3)
Gamma Globulin: 1.8 g/dL (ref 0.4–1.8)
Globulin, Total: 4.8 g/dL — ABNORMAL HIGH (ref 2.2–3.9)
M-Spike, %: 0.4 g/dL — ABNORMAL HIGH
Total Protein ELP: 8 g/dL (ref 6.0–8.5)

## 2021-08-30 LAB — BASIC METABOLIC PANEL
Anion gap: 9 (ref 5–15)
BUN: 64 mg/dL — ABNORMAL HIGH (ref 6–20)
CO2: 24 mmol/L (ref 22–32)
Calcium: 8.7 mg/dL — ABNORMAL LOW (ref 8.9–10.3)
Chloride: 88 mmol/L — ABNORMAL LOW (ref 98–111)
Creatinine, Ser: 1.93 mg/dL — ABNORMAL HIGH (ref 0.44–1.00)
GFR, Estimated: 29 mL/min — ABNORMAL LOW (ref >60)
Glucose, Bld: 109 mg/dL — ABNORMAL HIGH (ref 70–99)
Potassium: 3.2 mmol/L — ABNORMAL LOW (ref 3.5–5.1)
Sodium: 121 mmol/L — ABNORMAL LOW (ref 135–145)

## 2021-08-30 LAB — PHOSPHORUS: Phosphorus: 2.2 mg/dL — ABNORMAL LOW (ref 2.5–4.6)

## 2021-08-30 LAB — MAGNESIUM: Magnesium: 1.7 mg/dL (ref 1.7–2.4)

## 2021-08-30 MED ORDER — BOOST / RESOURCE BREEZE PO LIQD CUSTOM
1.0000 | Freq: Three times a day (TID) | ORAL | Status: DC
  Administered 2021-08-30 – 2021-09-01 (×6): 1 via ORAL

## 2021-08-30 MED ORDER — POTASSIUM PHOSPHATES 15 MMOLE/5ML IV SOLN
15.0000 mmol | Freq: Once | INTRAVENOUS | Status: AC
  Administered 2021-08-30: 15 mmol via INTRAVENOUS
  Filled 2021-08-30: qty 5

## 2021-08-30 MED ORDER — ADULT MULTIVITAMIN W/MINERALS CH
1.0000 | ORAL_TABLET | Freq: Every day | ORAL | Status: DC
  Administered 2021-08-31 – 2021-09-01 (×2): 1 via ORAL
  Filled 2021-08-30 (×2): qty 1

## 2021-08-30 NOTE — Progress Notes (Signed)
[facility]  ROUNDING NOTE   Subjective:   Patient sitting up in bed Alert and oriented Eating breakfast, states she just finished grooming self at sink Per nursing note, pt ate hamburger brought in by family last night. Patient anxious for discharge plan  Objective:  Vital signs in last 24 hours:  Temp:  [97.6 F (36.4 C)-98.1 F (36.7 C)] 98.1 F (36.7 C) (09/07 1201) Pulse Rate:  [66-88] 78 (09/07 1201) Resp:  [16-18] 18 (09/07 1201) BP: (100-109)/(45-67) 100/45 (09/07 1201) SpO2:  [96 %-100 %] 97 % (09/07 1201)  Weight change:  Filed Weights   08/26/21 2148  Weight: 47 kg    Intake/Output: I/O last 3 completed shifts: In: 4207.5 [P.O.:798; I.V.:3409.5] Out: 800 [Urine:800]   Intake/Output this shift:  No intake/output data recorded.  Physical Exam: General: No acute distress  Head: Normocephalic, atraumatic. Moist oral mucosal membranes  Eyes: Anicteric  Lungs:  Clear to auscultation, normal effort  Heart: S1S2 no rubs  Abdomen:  Soft, nontender, bowel sounds present  Extremities: No peripheral edema.  Neurologic: Awake, alert, following commands  Skin: No acute rash, no lesions       Basic Metabolic Panel: Recent Labs  Lab 08/26/21 2302 08/26/21 2321 08/27/21 0147 08/27/21 0421 08/27/21 1054 08/27/21 1427 08/27/21 2328 08/28/21 0521 08/28/21 1627 08/29/21 0717 08/29/21 1417 08/29/21 2140 08/30/21 0600  NA  --    < > 104* 106* 107* 106*   < > 107* 112* 113* 115* 117* 121*  K  --    < > 5.3* 4.8 4.4 3.6  --  3.4*  --  3.3*  --   --  3.2*  CL  --    < > 70* 72* 74* 68*  --  66*  --  72*  --   --  88*  CO2  --    < > 17* 15* 17* 19*  --  25  --  25  --   --  24  GLUCOSE  --    < > 116* 112* 112* 133*  --  131*  --  97  --   --  109*  BUN  --    < > 121* 118* 109* 117*  --  102*  --  90*  --   --  64*  CREATININE  --    < > 9.08* 9.15* 8.02* 8.61*  --  7.24*  --  3.56*  --   --  1.93*  CALCIUM  --    < > 9.3 9.4 8.4* 9.1  --  8.9   --  9.2  --   --  8.7*  MG 2.1  --   --  2.0  --   --   --  1.9  --  1.8  --   --  1.7  PHOS  --   --  7.2* 6.8*  --   --   --   --   --  3.5  --   --  2.2*   < > = values in this interval not displayed.     Liver Function Tests: Recent Labs  Lab 08/26/21 2231 08/27/21 0421  AST 14* 14*  ALT 8 10  ALKPHOS 108 94  BILITOT 0.8 0.8  PROT 9.5* 8.7*  ALBUMIN 3.9 3.5    Recent Labs  Lab 08/26/21 2231  LIPASE 33    No results for input(s): AMMONIA in the last 168 hours.  CBC: Recent Labs  Lab 08/26/21  2231 08/27/21 0421 08/28/21 0521 08/29/21 0717 08/30/21 0600  WBC 12.3* 12.2* 9.6 6.9 8.3  NEUTROABS 10.0* 8.8*  --   --   --   HGB 13.7 12.6 12.3 11.8* 10.2*  HCT 37.0 33.9* RESULTS UNAVAILABLE DUE TO INTERFERING SUBSTANCE 31.7* 28.0*  MCV 89.4 85.8 RESULTS UNAVAILABLE DUE TO INTERFERING SUBSTANCE 86.8 90.6  PLT 704* 700* 655* 642* 575*     Cardiac Enzymes: Recent Labs  Lab 08/27/21 0147  CKTOTAL 276*     BNP: Invalid input(s): POCBNP  CBG: No results for input(s): GLUCAP in the last 168 hours.  Microbiology: Results for orders placed or performed during the hospital encounter of 08/26/21  Urine Culture     Status: None   Collection Time: 08/27/21 12:06 AM   Specimen: Urine, Clean Catch  Result Value Ref Range Status   Specimen Description   Final    URINE, CLEAN CATCH Performed at [facility], [address] [place], [place] [address]    Special Requests   Final    NONE Performed at [facility], [address] [place], [place] [address]    Culture   Final    NO GROWTH Performed at [facility], [place] [address] [place], [place] [address]    Report Status 08/28/2021 FINAL  Final  Resp Panel by RT-PCR (Flu A&B, Covid) Nasopharyngeal Swab     Status: None   Collection Time: 08/27/21  4:21 AM   Specimen: Nasopharyngeal Swab; Nasopharyngeal(NP) swabs in vial transport medium  Result Value Ref Range Status   SARS  Coronavirus 2 by RT PCR NEGATIVE NEGATIVE Final    Comment: (NOTE) SARS-CoV-2 target nucleic acids are NOT DETECTED.  The SARS-CoV-2 RNA is generally detectable in upper respiratory specimens during the acute phase of infection. The lowest concentration of SARS-CoV-2 viral copies this assay can detect is 138 copies/mL. A negative result does not preclude SARS-Cov-2 infection and should not be used as the sole basis for treatment or other patient management decisions. A negative result may occur with  improper specimen collection/handling, submission of specimen other than nasopharyngeal swab, presence of viral mutation(s) within the areas targeted by this assay, and inadequate number of viral copies(<138 copies/mL). A negative result must be combined with clinical observations, patient history, and epidemiological information. The expected result is Negative.  Fact Sheet for Patients:  EntrepreneurPulse.com.au  Fact Sheet for Healthcare Providers:  IncredibleEmployment.be  This test is no t yet approved or cleared by the [place] FDA and  has been authorized for detection and/or diagnosis of SARS-CoV-2 by FDA under an Emergency Use Authorization (EUA). This EUA will remain  in effect (meaning this test can be used) for the duration of the COVID-19 declaration under Section 564(b)(1) of the Act, 21 U.S.C.section 360bbb-3(b)(1), unless the authorization is terminated  or revoked sooner.       Influenza A by PCR NEGATIVE NEGATIVE Final   Influenza B by PCR NEGATIVE NEGATIVE Final    Comment: (NOTE) The Xpert Xpress SARS-CoV-2/FLU/RSV plus assay is intended as an aid in the diagnosis of influenza from Nasopharyngeal swab specimens and should not be used as a sole basis for treatment. Nasal washings and aspirates are unacceptable for Xpert Xpress SARS-CoV-2/FLU/RSV testing.  Fact Sheet for  Patients: EntrepreneurPulse.com.au  Fact Sheet for Healthcare Providers: IncredibleEmployment.be  This test is not yet approved or cleared by the [place] FDA and has been authorized for detection and/or diagnosis of SARS-CoV-2 by FDA under an Emergency  Use Authorization (EUA). This EUA will remain in effect (meaning this test can be used) for the duration of the COVID-19 declaration under Section 564(b)(1) of the Act, 21 U.S.C. section 360bbb-3(b)(1), unless the authorization is terminated or revoked.  Performed at [facility], [address] [place], [place] [address]     Coagulation Studies: No results for input(s): LABPROT, INR in the last 72 hours.   Urinalysis: No results for input(s): COLORURINE, LABSPEC, PHURINE, GLUCOSEU, HGBUR, BILIRUBINUR, KETONESUR, PROTEINUR, UROBILINOGEN, NITRITE, LEUKOCYTESUR in the last 72 hours.  Invalid input(s): APPERANCEUR     Imaging: No results found.   Medications:    sodium chloride 125 mL/hr at 08/30/21 0559   potassium PHOSPHATE IVPB (in mmol) 15 mmol (08/30/21 1225)    escitalopram  5 mg Oral Daily   heparin injection (subcutaneous)  5,000 Units Subcutaneous Q8H   nicotine  7 mg Transdermal Daily   acetaminophen **OR** acetaminophen, metoCLOPramide (REGLAN) injection, Muscle Rub  Assessment/ Plan:  60 y.o. female with past medical history of frequent urinary tract infections, tobacco abuse, prior history of proteinuria, history of mild to moderate hydronephrosis who was admitted with severe acute kidney injury and hyponatremia.  1.  Acute kidney injury/hematuria/proteinuria.  The patient has had proteinuria going back to August of last year.  Also had hematuria then as well.  We will proceed with additional work-up including SPEP, ANA, ANCA antibodies, GBM antibodies. Unclear as to what role mild to moderate hydronephrosis playing as there is some chronicity to this.  No  acute need for dialysis. Creatinine greatly improved. Will continue IVF and continue to encourage oral intake.    2.  Hyponatremia/hypokalemia.  Serum sodium coming up albeit very slowly.  Currently 121. Continue 0.9NS @ 149ml/hr. Oral potassium supplementation as ordered  3.  Mild to moderate bilateral hydronephrosis.  Appears that this has some element of chronicity.  Most recent imaging actually showed improvement from prior imaging studies in terms of the degree of hydronephrosis.  May recommend Urology to further workup   LOS: 3 Jennalynn Rivard 9/7/20221:57 PM

## 2021-08-30 NOTE — Evaluation (Signed)
Occupational Therapy Evaluation Patient Details Name: Karen Mooney MRN: [id number] DOB: [birth year] Today's Date: 08/30/2021    History of Present Illness 60 y.o. female with medical history significant of recurrent UTIs, tobacco abuse , weight loss.  Family field for involuntary commitment as she apparently has been hardly eating or drinking recently and generalized weakness.  Was seen at [facility] on 27 August for presumed UTI.   Clinical Impression   Patient presenting with  decreased I in self care, balance, functional mobility/transfers, endurance, strength, and safety awareness.  Patient reports living at home alone in boarding house independently. She shares a bathroom and kitchen space. She does not drive at baseline. Her brother lives nearby and checks on her and assists her as needed per pt reprot. Patient performed short distance ambulation, functional transfers, and standing ADL tasks with supervision without use of AD. HR did increase to 120's with standing tasks with all other vitals WNLs. Patient will benefit from acute OT to increase overall independence in the areas of ADLs, functional mobility, and safety awareness in order to safely discharge home.    Follow Up Recommendations  Home health OT;Supervision - Intermittent    Equipment Recommendations  None recommended by OT       Precautions / Restrictions Precautions Precautions: Fall      Mobility Bed Mobility Overal bed mobility: Modified Independent             General bed mobility comments: increased time with HOB elevated    Transfers Overall transfer level: Needs assistance Equipment used: None Transfers: Sit to/from [employer] Transfers Sit to Stand: Supervision Stand pivot transfers: Supervision            Balance Overall balance assessment: Needs assistance   Sitting balance-Leahy Scale: Good     Standing balance support: Bilateral upper extremity supported Standing balance-Leahy  Scale: Fair                             ADL either performed or assessed with clinical judgement   ADL Overall ADL's : Needs assistance/impaired     Grooming: Wash/dry hands;Wash/dry face;Oral care;Brushing hair;Standing;Supervision/safety               Lower Body Dressing: Supervision/safety;Sit to/from stand               Functional mobility during ADLs: Supervision/safety General ADL Comments: close supervision for safety with functional tasks and ambulation. Pt's HR increased to 120's with self care tasks. Min cuing for safety awareness.     Vision Patient Visual Report: No change from baseline              Pertinent Vitals/Pain Pain Assessment: No/denies pain     Hand Dominance Right   Extremity/Trunk Assessment Upper Extremity Assessment Upper Extremity Assessment: Generalized weakness;Overall [facility] for tasks assessed   Lower Extremity Assessment Lower Extremity Assessment: Overall WFL for tasks assessed;Generalized weakness   Cervical / Trunk Assessment Cervical / Trunk Assessment: Normal   Communication Communication Communication: No difficulties   Cognition Arousal/Alertness: Awake/alert Behavior During Therapy: WFL for tasks assessed/performed Overall Cognitive Status: Within Functional Limits for tasks assessed                                 General Comments: Pt pleasant and cooperative overall              Home Living  Family/patient expects to be discharged to:: Private residence Living Arrangements: Alone Available Help at Discharge: Family;Available PRN/intermittently Type of Home: Other(Comment) (boarding house- shared bathroom and kitchen area) Home Access: Level entry     Home Layout: One level     Bathroom Shower/Tub: Tub/shower unit         Home Equipment: None          Prior Functioning/Environment Level of Independence: Independent        Comments: Pt reports she still works. Lives  in boarding house independently per her report. Pt does not drive. Her brother leaves nearby and assists her with errands and getting to appointments.        OT Problem List: Decreased strength;Decreased activity tolerance;Decreased safety awareness;Impaired balance (sitting and/or standing)      OT Treatment/Interventions: Self-care/ADL training;Therapeutic exercise;Therapeutic activities;Energy conservation;Patient/family education;Balance training;Manual therapy    OT Goals(Current goals can be found in the care plan section) Acute Rehab OT Goals Patient Stated Goal: Go home OT Goal Formulation: With patient Time For Goal Achievement: 09/13/21 Potential to Achieve Goals: Good ADL Goals Pt Will Perform Grooming: with modified independence;standing Pt Will Perform Lower Body Dressing: with modified independence;sit to/from stand Pt Will Transfer to Toilet: with modified independence;ambulating Pt Will Perform Toileting - Clothing Manipulation and hygiene: with modified independence;sit to/from stand  OT Frequency: Min 2X/week   Barriers to D/C:    Pt does live alone but has family support          AM-PAC OT "6 Clicks" Daily Activity     Outcome Measure Help from another person eating meals?: None Help from another person taking care of personal grooming?: None Help from another person toileting, which includes using toliet, bedpan, or urinal?: A Little Help from another person bathing (including washing, rinsing, drying)?: A Little Help from another person to put on and taking off regular upper body clothing?: None Help from another person to put on and taking off regular lower body clothing?: A Little 6 Click Score: 21   End of Session Nurse Communication: Mobility status  Activity Tolerance: Patient tolerated treatment well Patient left: in bed;with call bell/phone within reach;with bed alarm set  OT Visit Diagnosis: Unsteadiness on feet (R26.81);Muscle weakness  (generalized) (M62.81)                Time: 0177-9390 OT Time Calculation (min): 23 min Charges:  OT General Charges $OT Visit: 1 Visit OT Evaluation $OT Eval Low Complexity: 1 Low OT Treatments $Self Care/Home Management : 8-22 mins Jackquline Denmark, MS, OTR/L , CBIS ascom [phone]  08/30/21, 10:51 AM

## 2021-08-30 NOTE — Progress Notes (Signed)
PROGRESS NOTE    Karen Mooney  DDU:[id number] DOB: 02-Feb-1961 DOA: 08/26/2021 PCP: Patient, No Pcp Per (Inactive)   Brief Narrative: Taken from prior notes. Karen Mooney is a 60 y.o. female with medical history significant of recurrent UTIs, tobacco abuse, weight loss whose family filed for IVC and brought pt to the hospital.   Family has been concerned that patient has been losing weight and not eating.  Was seen at [facility] on 27 August for presumed UTI was given prescription for Bactrim and Pyridium UA at the time showed persistent infection.  Urine cultures negative.  Found to have severe hyponatremia with sodium of 103 on presentation.  Initially requiring hypertonic saline, currently on normal saline.  Nephrology is on board.  Subjective: Patient was seen and examined today.  No new complaints.  She thinks that she is improving.  Assessment & Plan:   Principal Problem:   Hyponatremia Active Problems:   AKI (acute kidney injury) (HCC)   Hematuria   Weight loss   Malnutrition due to starvation (HCC)   Hyperkalemia   Tobacco abuse   Nausea & vomiting   Depression   Adjustment disorder   Protein-calorie malnutrition, severe  Hyponatremia.  Significant hyponatremia with sodium of 103 on presentation.  Most likely combination of decreased p.o. intake, and psychogenic polydipsia as she was drinking a lot of water. Improving-sodium at 121 this morning.  Nephrology is on board.  Initially received hypertonic saline. -Continue with normal saline for another day. -Monitor sodium every 8 hourly-discontinue normal saline if increasing more than 8 points in 24-hour.  AKI/hematuria and proteinuria.  Patient had significant AKI with creatinine at 9.08 on admission. History of chronic hydronephrosis which seems little improved on recent CT abdomen and pelvis.  History of chronic hematuria and proteinuria.  Creatinine improving, at 1.93 today with baseline around 1. -Nephrology ordered  labs for proteinuria work-up -Monitor renal function  Chronic bilateral hydronephrosis/bilateral nonobstructing nephrolithiasis.  Shows mild improvement in bilateral hydronephrosis on recent imaging. -Outpatient urology follow-up.  Hypokalemia/hypophosphatemia. -Replete electrolytes and monitor.  History of weight loss/protein caloric malnutrition.  Poor p.o. intake. Estimated body mass index is 18.34 kg/m as calculated from the following:   Height as of this encounter: 5\' 5"  (1.651 m).   Weight as of this encounter: 50 kg.  -Dietitian was consulted. -Continue to lose weight after improvement in p.o. intake she will need an outpatient oncologic work-up.  Tobacco abuse. -Nicotine patch   Objective: Vitals:   08/29/21 2336 08/30/21 0803 08/30/21 1201 08/30/21 1453  BP: 104/60 109/61 (!) 100/45   Pulse: 76 66 78   Resp: 18  18   Temp: 97.8 F (36.6 C) 98 F (36.7 C) 98.1 F (36.7 C)   TempSrc:  Oral Oral   SpO2: 100% 97% 97%   Weight:    50 kg  Height:        Intake/Output Summary (Last 24 hours) at 08/30/2021 1559 Last data filed at 08/30/2021 0805 Gross per 24 hour  Intake 3057.53 ml  Output 450 ml  Net 2607.53 ml   Filed Weights   08/26/21 2148 08/30/21 1453  Weight: 47 kg 50 kg    Examination:  General exam: Frail lady, appears calm and comfortable  Respiratory system: Clear to auscultation. Respiratory effort normal. Cardiovascular system: S1 & S2 heard, RRR.  Gastrointestinal system: Soft, nontender, nondistended, bowel sounds positive. Central nervous system: Alert and oriented. No focal neurological deficits.Symmetric 5 x 5 power. Extremities: No edema, no cyanosis,  pulses intact and symmetrical. Psychiatry: Judgement and insight appear normal.    DVT prophylaxis: Heparin Code Status: Full Family Communication:  Disposition Plan:  Status is: Inpatient  Remains inpatient appropriate because:Inpatient level of care appropriate due to severity of  illness  Dispo: The patient is from: Home              Anticipated d/c is to: Home              Patient currently is not medically stable to d/c.   Difficult to place patient No               Level of care: Progressive Cardiac  All the records are reviewed and case discussed with Care Management/Social Worker. Management plans discussed with the patient, nursing and they are in agreement.  Consultants:  Nephrology  Procedures:  Antimicrobials:   Data Reviewed: I have personally reviewed following labs and imaging studies  CBC: Recent Labs  Lab 08/26/21 2231 08/27/21 0421 08/28/21 0521 08/29/21 0717 08/30/21 0600  WBC 12.3* 12.2* 9.6 6.9 8.3  NEUTROABS 10.0* 8.8*  --   --   --   HGB 13.7 12.6 12.3 11.8* 10.2*  HCT 37.0 33.9* RESULTS UNAVAILABLE DUE TO INTERFERING SUBSTANCE 31.7* 28.0*  MCV 89.4 85.8 RESULTS UNAVAILABLE DUE TO INTERFERING SUBSTANCE 86.8 90.6  PLT 704* 700* 655* 642* 575*   Basic Metabolic Panel: Recent Labs  Lab 08/26/21 2302 08/26/21 2321 08/27/21 0147 08/27/21 0421 08/27/21 1054 08/27/21 1427 08/27/21 2328 08/28/21 0521 08/28/21 1627 08/29/21 0717 08/29/21 1417 08/29/21 2140 08/30/21 0600 08/30/21 1359  NA  --    < > 104* 106* 107* 106*   < > 107*   < > 113* 115* 117* 121* 123*  K  --    < > 5.3* 4.8 4.4 3.6  --  3.4*  --  3.3*  --   --  3.2*  --   CL  --    < > 70* 72* 74* 68*  --  66*  --  72*  --   --  88*  --   CO2  --    < > 17* 15* 17* 19*  --  25  --  25  --   --  24  --   GLUCOSE  --    < > 116* 112* 112* 133*  --  131*  --  97  --   --  109*  --   BUN  --    < > 121* 118* 109* 117*  --  102*  --  90*  --   --  64*  --   CREATININE  --    < > 9.08* 9.15* 8.02* 8.61*  --  7.24*  --  3.56*  --   --  1.93*  --   CALCIUM  --    < > 9.3 9.4 8.4* 9.1  --  8.9  --  9.2  --   --  8.7*  --   MG 2.1  --   --  2.0  --   --   --  1.9  --  1.8  --   --  1.7  --   PHOS  --   --  7.2* 6.8*  --   --   --   --   --  3.5  --   --  2.2*  --    < > =  values in this interval not displayed.  GFR: Estimated Creatinine Clearance: 24.5 mL/min (A) (by C-G formula based on SCr of 1.93 mg/dL (H)). Liver Function Tests: Recent Labs  Lab 08/26/21 2231 08/27/21 0421  AST 14* 14*  ALT 8 10  ALKPHOS 108 94  BILITOT 0.8 0.8  PROT 9.5* 8.7*  ALBUMIN 3.9 3.5   Recent Labs  Lab 08/26/21 2231  LIPASE 33   No results for input(s): AMMONIA in the last 168 hours. Coagulation Profile: Recent Labs  Lab 08/27/21 0006  INR 1.0   Cardiac Enzymes: Recent Labs  Lab 08/27/21 0147  CKTOTAL 276*   BNP (last 3 results) No results for input(s): PROBNP in the last 8760 hours. HbA1C: No results for input(s): HGBA1C in the last 72 hours. CBG: No results for input(s): GLUCAP in the last 168 hours. Lipid Profile: No results for input(s): CHOL, HDL, LDLCALC, TRIG, CHOLHDL, LDLDIRECT in the last 72 hours. Thyroid Function Tests: No results for input(s): TSH, T4TOTAL, FREET4, T3FREE, THYROIDAB in the last 72 hours. Anemia Panel: No results for input(s): VITAMINB12, FOLATE, FERRITIN, TIBC, IRON, RETICCTPCT in the last 72 hours. Sepsis Labs: Recent Labs  Lab 08/27/21 0006  LATICACIDVEN 0.9    Recent Results (from the past 240 hour(s))  Urine Culture     Status: None   Collection Time: 08/27/21 12:06 AM   Specimen: Urine, Clean Catch  Result Value Ref Range Status   Specimen Description   Final    URINE, CLEAN CATCH Performed at [facility], [address] [place], [place] [address]    Special Requests   Final    NONE Performed at [facility], [address] [place], [place] [address]    Culture   Final    NO GROWTH Performed at [facility], [address] [place], [place] [address]    Report Status 08/28/2021 FINAL  Final  Resp Panel by RT-PCR (Flu A&B, Covid) Nasopharyngeal Swab     Status: None   Collection Time: 08/27/21  4:21 AM   Specimen: Nasopharyngeal Swab; Nasopharyngeal(NP) swabs in vial  transport medium  Result Value Ref Range Status   SARS Coronavirus 2 by RT PCR NEGATIVE NEGATIVE Final    Comment: (NOTE) SARS-CoV-2 target nucleic acids are NOT DETECTED.  The SARS-CoV-2 RNA is generally detectable in upper respiratory specimens during the acute phase of infection. The lowest concentration of SARS-CoV-2 viral copies this assay can detect is 138 copies/mL. A negative result does not preclude SARS-Cov-2 infection and should not be used as the sole basis for treatment or other patient management decisions. A negative result may occur with  improper specimen collection/handling, submission of specimen other than nasopharyngeal swab, presence of viral mutation(s) within the areas targeted by this assay, and inadequate number of viral copies(<138 copies/mL). A negative result must be combined with clinical observations, patient history, and epidemiological information. The expected result is Negative.  Fact Sheet for Patients:  BloggerCourse.comhttps://www.fda.gov/media/152166/download  Fact Sheet for Healthcare Providers:  SeriousBroker.ithttps://www.fda.gov/media/152162/download  This test is no t yet approved or cleared by the [place] FDA and  has been authorized for detection and/or diagnosis of SARS-CoV-2 by FDA under an Emergency Use Authorization (EUA). This EUA will remain  in effect (meaning this test can be used) for the duration of the COVID-19 declaration under Section 564(b)(1) of the Act, 21 U.S.C.section 360bbb-3(b)(1), unless the authorization is terminated  or revoked sooner.       Influenza A by PCR NEGATIVE NEGATIVE Final   Influenza B by PCR  NEGATIVE NEGATIVE Final    Comment: (NOTE) The Xpert Xpress SARS-CoV-2/FLU/RSV plus assay is intended as an aid in the diagnosis of influenza from Nasopharyngeal swab specimens and should not be used as a sole basis for treatment. Nasal washings and aspirates are unacceptable for Xpert Xpress SARS-CoV-2/FLU/RSV testing.  Fact  Sheet for Patients: BloggerCourse.com  Fact Sheet for Healthcare Providers: SeriousBroker.it  This test is not yet approved or cleared by the [place] FDA and has been authorized for detection and/or diagnosis of SARS-CoV-2 by FDA under an Emergency Use Authorization (EUA). This EUA will remain in effect (meaning this test can be used) for the duration of the COVID-19 declaration under Section 564(b)(1) of the Act, 21 U.S.C. section 360bbb-3(b)(1), unless the authorization is terminated or revoked.  Performed at [facility], [address], [place], [place] [address]      Radiology Studies: No results found.  Scheduled Meds:  escitalopram  5 mg Oral Daily   feeding supplement  1 Container Oral TID BM   heparin injection (subcutaneous)  5,000 Units Subcutaneous Q8H   [START ON 08/31/2021] multivitamin with minerals  1 tablet Oral Daily   nicotine  7 mg Transdermal Daily   Continuous Infusions:  sodium chloride 125 mL/hr at 08/30/21 1548   potassium PHOSPHATE IVPB (in mmol) 15 mmol (08/30/21 1225)     LOS: 3 days   Time spent: 45 minutes. More than 50% of the time was spent in counseling/coordination of care  Arnetha Courser, MD [facility]  If 7PM-7AM, please contact night-coverage [email or web address]  08/30/2021, 3:59 PM   This record has been created using Conservation officer, historic buildings. Errors have been sought and corrected,but may not always be located. Such creation errors do not reflect on the standard of care.

## 2021-08-30 NOTE — Progress Notes (Signed)
Initial Nutrition Assessment  DOCUMENTATION CODES:   Severe malnutrition in context of social or environmental circumstances  INTERVENTION:   Boost Breeze po TID, each supplement provides 250 kcal and 9 grams of protein  Magic cup TID with meals, each supplement provides 290 kcal and 9 grams of protein  MVI po daily   Liberalize diet   Pt at high refeed risk; recommend monitor potassium, magnesium and phosphorus labs daily until stable  NUTRITION DIAGNOSIS:   Severe Malnutrition related to social / environmental circumstances (chronic poor po) as evidenced by severe muscle depletion, severe fat depletion.  GOAL:   Patient will meet greater than or equal to 90% of their needs  MONITOR:   PO intake, Supplement acceptance, Labs, Weight trends, Skin, I & O's  REASON FOR ASSESSMENT:   Consult Assessment of nutrition requirement/status  ASSESSMENT:   60 y.o. female with past medical history of frequent urinary tract infections, tobacco abuse, prior history of proteinuria and mild to moderate hydronephrosis who was admitted with severe acute kidney injury, poor appetite and hyponatremia.  Met with pt in room today. Pt reports fair appetite and oral intake at baseline and pta but per family report, pt has not eaten for the past two weeks. Pt is anxious to go home and reports that she is eating well in hospital. Pt reports eating 100% of a hamburger for dinner last night, 100% of bacon, eggs and toast for breakfast and reports eating a salad for lunch today. RD discussed with pt the importance of adequate nutrition needed to preserve lean muscle. Pt is reluctant to try any supplements but finally agrees to try mixed berry Boost Breeze and vanilla Magic Cups. RD will add supplements and MVI to help pt meet her estimated needs. RD will also liberalize pt's diet. Pt is currently refeeding; electrolytes being replaced. There is no documented weight history in chart to determine if any  significant weight changes. Pt is unsure of what her UBW is but does feel that she has lost weight.   Medications reviewed and include: heparin, nicotine, NaCl @125ml/hr, Kphos  Labs reviewed: Na 123(L), K 3.2(L), BUN 64(H), creat 1.93(H), P 2.2(L), Mg 1.7 wnl Hgb 10.2(L), Hct 28.0(L)  NUTRITION - FOCUSED PHYSICAL EXAM:  Flowsheet Row Most Recent Value  Orbital Region Moderate depletion  Upper Arm Region Severe depletion  Thoracic and Lumbar Region Severe depletion  Buccal Region Moderate depletion  Temple Region Severe depletion  Clavicle Bone Region Severe depletion  Clavicle and Acromion Bone Region Severe depletion  Scapular Bone Region Severe depletion  Dorsal Hand Severe depletion  Patellar Region Severe depletion  Anterior Thigh Region Severe depletion  Posterior Calf Region Severe depletion  Edema (RD Assessment) None  Hair Reviewed  Eyes Reviewed  Mouth Reviewed  Skin Reviewed  Nails Reviewed   Diet Order:   Diet Order             Diet regular Room service appropriate? Yes; Fluid consistency: Thin; Fluid restriction: 1200 mL Fluid  Diet effective now                  EDUCATION NEEDS:   Education needs have been addressed  Skin:  Skin Assessment: Reviewed RN Assessment  Last BM:  pta  Height:   Ht Readings from Last 1 Encounters:  08/26/21 5' 5" (1.651 m)    Weight:   Wt Readings from Last 1 Encounters:  08/30/21 50 kg    Ideal Body Weight:  56.8 kg  BMI:    Body mass index is 18.34 kg/m.  Estimated Nutritional Needs:   Kcal:  1500-1700kcal/day  Protein:  75-85g/day  Fluid:  1.5-1.8L/day  Casey Campbell MS, RD, LDN Please refer to AMION for RD and/or RD on-call/weekend/after hours pager  

## 2021-08-31 LAB — BASIC METABOLIC PANEL
Anion gap: 3 — ABNORMAL LOW (ref 5–15)
BUN: 41 mg/dL — ABNORMAL HIGH (ref 6–20)
CO2: 21 mmol/L — ABNORMAL LOW (ref 22–32)
Calcium: 7.9 mg/dL — ABNORMAL LOW (ref 8.9–10.3)
Chloride: 103 mmol/L (ref 98–111)
Creatinine, Ser: 1.05 mg/dL — ABNORMAL HIGH (ref 0.44–1.00)
GFR, Estimated: >60 mL/min (ref >60)
Glucose, Bld: 93 mg/dL (ref 70–99)
Potassium: 4 mmol/L (ref 3.5–5.1)
Sodium: 127 mmol/L — ABNORMAL LOW (ref 135–145)

## 2021-08-31 LAB — CBC
HCT: 24.6 % — ABNORMAL LOW (ref 36.0–46.0)
Hemoglobin: 8.4 g/dL — ABNORMAL LOW (ref 12.0–15.0)
MCH: 32.2 pg (ref 26.0–34.0)
MCHC: 34.1 g/dL (ref 30.0–36.0)
MCV: 94.3 fL (ref 80.0–100.0)
Platelets: 493 10*3/uL — ABNORMAL HIGH (ref 150–400)
RBC: 2.61 MIL/uL — ABNORMAL LOW (ref 3.87–5.11)
RDW: 11.9 % (ref 11.5–15.5)
WBC: 11 10*3/uL — ABNORMAL HIGH (ref 4.0–10.5)
nRBC: 0 % (ref 0.0–0.2)

## 2021-08-31 LAB — SODIUM
Sodium: 128 mmol/L — ABNORMAL LOW (ref 135–145)
Sodium: 129 mmol/L — ABNORMAL LOW (ref 135–145)

## 2021-08-31 LAB — PROTEIN ELECTRO, RANDOM URINE
Albumin ELP, Urine: 42.8 %
Alpha-1-Globulin, U: 5.9 %
Alpha-2-Globulin, U: 16.3 %
Beta Globulin, U: 21.4 %
Gamma Globulin, U: 13.6 %
Total Protein, Urine: 130.2 mg/dL

## 2021-08-31 LAB — MAGNESIUM: Magnesium: 1.3 mg/dL — ABNORMAL LOW (ref 1.7–2.4)

## 2021-08-31 LAB — ANCA PROFILE
Anti-MPO Antibodies: <0.2 units (ref 0.0–0.9)
Anti-PR3 Antibodies: <0.2 units (ref 0.0–0.9)
Atypical P-ANCA titer: <1:20 {titer}
C-ANCA: <1:20 {titer}
P-ANCA: <1:20 {titer}

## 2021-08-31 LAB — PHOSPHORUS: Phosphorus: 2.3 mg/dL — ABNORMAL LOW (ref 2.5–4.6)

## 2021-08-31 MED ORDER — SODIUM PHOSPHATES 45 MMOLE/15ML IV SOLN
30.0000 mmol | Freq: Once | INTRAVENOUS | Status: AC
  Administered 2021-08-31: 30 mmol via INTRAVENOUS
  Filled 2021-08-31: qty 10

## 2021-08-31 MED ORDER — MAGNESIUM SULFATE 2 GM/50ML IV SOLN
2.0000 g | Freq: Once | INTRAVENOUS | Status: AC
  Administered 2021-08-31: 2 g via INTRAVENOUS
  Filled 2021-08-31: qty 50

## 2021-08-31 NOTE — Progress Notes (Signed)
PROGRESS NOTE    ALYANA KREITER  ERD:[id number] DOB: 02-Apr-1961 DOA: 08/26/2021 PCP: Patient, No Pcp Per (Inactive)   Brief Narrative: Taken from prior notes. ANDI LAYFIELD is a 60 y.o. female with medical history significant of recurrent UTIs, tobacco abuse, weight loss whose family filed for IVC and brought pt to the hospital.   Family has been concerned that patient has been losing weight and not eating.  Was seen at [facility] on 27 August for presumed UTI was given prescription for Bactrim and Pyridium UA at the time showed persistent infection.  Urine cultures negative.  Found to have severe hyponatremia with sodium of 103 on presentation.  Initially requiring hypertonic saline, currently on normal saline.  Nephrology is on board.  Subjective: Patient has no new complaint today.  Agreeable to go home when sodium improves little more.  Assessment & Plan:   Principal Problem:   Hyponatremia Active Problems:   AKI (acute kidney injury) (HCC)   Hematuria   Weight loss   Malnutrition due to starvation (HCC)   Hyperkalemia   Tobacco abuse   Nausea & vomiting   Depression   Adjustment disorder   Protein-calorie malnutrition, severe  Hyponatremia.  Significant hyponatremia with sodium of 103 on presentation.  Most likely combination of decreased p.o. intake, and psychogenic polydipsia as she was drinking a lot of water. Improving-sodium at 127 this morning.  Nephrology is on board.  Initially received hypertonic saline. -Monitor sodium every 8 hourly-discontinue normal saline if increasing more than 8 points in 24-hour.  AKI/hematuria and proteinuria.  Patient had significant AKI with creatinine at 9.08 on admission. History of chronic hydronephrosis which seems little improved on recent CT abdomen and pelvis.  History of chronic hematuria and proteinuria.  Creatinine improving, at 1.05 today with baseline around 1. -Nephrology ordered labs for proteinuria work-up -Monitor renal  function  Chronic bilateral hydronephrosis/bilateral nonobstructing nephrolithiasis.  Shows mild improvement in bilateral hydronephrosis on recent imaging. -Outpatient urology follow-up.  Hypokalemia/hypophosphatemia.  Hypokalemia resolved.  Hypomagnesemia and hypophosphatemia today. -Replete electrolytes and monitor.  History of weight loss/protein caloric malnutrition.  Poor p.o. intake. Estimated body mass index is 18.34 kg/m as calculated from the following:   Height as of this encounter: 5\' 5"  (1.651 m).   Weight as of this encounter: 50 kg.  -Dietitian was consulted. -Continue to lose weight after improvement in p.o. intake she will need an outpatient oncologic work-up.  Tobacco abuse. -Nicotine patch   Objective: Vitals:   08/30/21 1958 08/31/21 0026 08/31/21 0826 08/31/21 1158  BP: (!) 96/47 108/67 116/64 (!) 107/55  Pulse: 79 81 76 87  Resp: 18 20 18 18   Temp: 98.2 F (36.8 C) 98 F (36.7 C) 98.4 F (36.9 C) 97.6 F (36.4 C)  TempSrc: Oral Oral Oral Oral  SpO2: 100% 97% 100% 100%  Weight:      Height:        Intake/Output Summary (Last 24 hours) at 08/31/2021 1550 Last data filed at 08/31/2021 0826 Gross per 24 hour  Intake 2799.71 ml  Output 1800 ml  Net 999.71 ml    Filed Weights   08/26/21 2148 08/30/21 1453  Weight: 47 kg 50 kg    Examination:  General.  Malnourished lady, in no acute distress. Pulmonary.  Lungs clear bilaterally, normal respiratory effort. CV.  Regular rate and rhythm, no JVD, rub or murmur. Abdomen.  Soft, nontender, nondistended, BS positive. CNS.  Alert and oriented.  No focal neurologic deficit. Extremities.  No edema,  no cyanosis, pulses intact and symmetrical. Psychiatry.  Judgment and insight appears normal.    DVT prophylaxis: Heparin Code Status: Full Family Communication:  Disposition Plan:  Status is: Inpatient  Remains inpatient appropriate because:Inpatient level of care appropriate due to severity of  illness  Dispo: The patient is from: Home              Anticipated d/c is to: Home              Patient currently is not medically stable to d/c.   Difficult to place patient No               Level of care: Progressive Cardiac  All the records are reviewed and case discussed with Care Management/Social Worker. Management plans discussed with the patient, nursing and they are in agreement.  Consultants:  Nephrology  Procedures:  Antimicrobials:   Data Reviewed: I have personally reviewed following labs and imaging studies  CBC: Recent Labs  Lab 08/26/21 2231 08/27/21 0421 08/28/21 0521 08/29/21 0717 08/30/21 0600 08/31/21 0535  WBC 12.3* 12.2* 9.6 6.9 8.3 11.0*  NEUTROABS 10.0* 8.8*  --   --   --   --   HGB 13.7 12.6 12.3 11.8* 10.2* 8.4*  HCT 37.0 33.9* RESULTS UNAVAILABLE DUE TO INTERFERING SUBSTANCE 31.7* 28.0* 24.6*  MCV 89.4 85.8 RESULTS UNAVAILABLE DUE TO INTERFERING SUBSTANCE 86.8 90.6 94.3  PLT 704* 700* 655* 642* 575* 493*    Basic Metabolic Panel: Recent Labs  Lab 08/27/21 0147 08/27/21 0421 08/27/21 1054 08/27/21 1427 08/27/21 2328 08/28/21 0521 08/28/21 1627 08/29/21 0717 08/29/21 1417 08/30/21 0600 08/30/21 1359 08/30/21 2307 08/31/21 0535 08/31/21 1357  NA 104* 106*   < > 106*   < > 107*   < > 113*   < > 121* 123* 126* 127* 128*  K 5.3* 4.8   < > 3.6  --  3.4*  --  3.3*  --  3.2*  --   --  4.0  --   CL 70* 72*   < > 68*  --  66*  --  72*  --  88*  --   --  103  --   CO2 17* 15*   < > 19*  --  25  --  25  --  24  --   --  21*  --   GLUCOSE 116* 112*   < > 133*  --  131*  --  97  --  109*  --   --  93  --   BUN 121* 118*   < > 117*  --  102*  --  90*  --  64*  --   --  41*  --   CREATININE 9.08* 9.15*   < > 8.61*  --  7.24*  --  3.56*  --  1.93*  --   --  1.05*  --   CALCIUM 9.3 9.4   < > 9.1  --  8.9  --  9.2  --  8.7*  --   --  7.9*  --   MG  --  2.0  --   --   --  1.9  --  1.8  --  1.7  --   --  1.3*  --   PHOS 7.2* 6.8*  --   --   --   --    --  3.5  --  2.2*  --   --  2.3*  --    < > =  values in this interval not displayed.    GFR: Estimated Creatinine Clearance: 45 mL/min (A) (by C-G formula based on SCr of 1.05 mg/dL (H)). Liver Function Tests: Recent Labs  Lab 08/26/21 2231 08/27/21 0421  AST 14* 14*  ALT 8 10  ALKPHOS 108 94  BILITOT 0.8 0.8  PROT 9.5* 8.7*  ALBUMIN 3.9 3.5    Recent Labs  Lab 08/26/21 2231  LIPASE 33    No results for input(s): AMMONIA in the last 168 hours. Coagulation Profile: Recent Labs  Lab 08/27/21 0006  INR 1.0    Cardiac Enzymes: Recent Labs  Lab 08/27/21 0147  CKTOTAL 276*    BNP (last 3 results) No results for input(s): PROBNP in the last 8760 hours. HbA1C: No results for input(s): HGBA1C in the last 72 hours. CBG: No results for input(s): GLUCAP in the last 168 hours. Lipid Profile: No results for input(s): CHOL, HDL, LDLCALC, TRIG, CHOLHDL, LDLDIRECT in the last 72 hours. Thyroid Function Tests: No results for input(s): TSH, T4TOTAL, FREET4, T3FREE, THYROIDAB in the last 72 hours. Anemia Panel: No results for input(s): VITAMINB12, FOLATE, FERRITIN, TIBC, IRON, RETICCTPCT in the last 72 hours. Sepsis Labs: Recent Labs  Lab 08/27/21 0006  LATICACIDVEN 0.9     Recent Results (from the past 240 hour(s))  Urine Culture     Status: None   Collection Time: 08/27/21 12:06 AM   Specimen: Urine, Clean Catch  Result Value Ref Range Status   Specimen Description   Final    URINE, CLEAN CATCH Performed at [facility], [address] [place], [place] [address]    Special Requests   Final    NONE Performed at [facility], [address] [place], [place] [address]    Culture   Final    NO GROWTH Performed at [facility], [address] [place], [place] [address]    Report Status 08/28/2021 FINAL  Final  Resp Panel by RT-PCR (Flu A&B, Covid) Nasopharyngeal Swab     Status: None   Collection Time: 08/27/21  4:21 AM   Specimen:  Nasopharyngeal Swab; Nasopharyngeal(NP) swabs in vial transport medium  Result Value Ref Range Status   SARS Coronavirus 2 by RT PCR NEGATIVE NEGATIVE Final    Comment: (NOTE) SARS-CoV-2 target nucleic acids are NOT DETECTED.  The SARS-CoV-2 RNA is generally detectable in upper respiratory specimens during the acute phase of infection. The lowest concentration of SARS-CoV-2 viral copies this assay can detect is 138 copies/mL. A negative result does not preclude SARS-Cov-2 infection and should not be used as the sole basis for treatment or other patient management decisions. A negative result may occur with  improper specimen collection/handling, submission of specimen other than nasopharyngeal swab, presence of viral mutation(s) within the areas targeted by this assay, and inadequate number of viral copies(<138 copies/mL). A negative result must be combined with clinical observations, patient history, and epidemiological information. The expected result is Negative.  Fact Sheet for Patients:  BloggerCourse.comhttps://www.fda.gov/media/152166/download  Fact Sheet for Healthcare Providers:  SeriousBroker.ithttps://www.fda.gov/media/152162/download  This test is no t yet approved or cleared by the [place] FDA and  has been authorized for detection and/or diagnosis of SARS-CoV-2 by FDA under an Emergency Use Authorization (EUA). This EUA will remain  in effect (meaning this test can be used) for the duration of the COVID-19 declaration under Section 564(b)(1) of the Act, 21 U.S.C.section 360bbb-3(b)(1), unless the authorization is terminated  or revoked sooner.  Influenza A by PCR NEGATIVE NEGATIVE Final   Influenza B by PCR NEGATIVE NEGATIVE Final    Comment: (NOTE) The Xpert Xpress SARS-CoV-2/FLU/RSV plus assay is intended as an aid in the diagnosis of influenza from Nasopharyngeal swab specimens and should not be used as a sole basis for treatment. Nasal washings and aspirates are unacceptable for  Xpert Xpress SARS-CoV-2/FLU/RSV testing.  Fact Sheet for Patients: BloggerCourse.com  Fact Sheet for Healthcare Providers: SeriousBroker.it  This test is not yet approved or cleared by the [place] FDA and has been authorized for detection and/or diagnosis of SARS-CoV-2 by FDA under an Emergency Use Authorization (EUA). This EUA will remain in effect (meaning this test can be used) for the duration of the COVID-19 declaration under Section 564(b)(1) of the Act, 21 U.S.C. section 360bbb-3(b)(1), unless the authorization is terminated or revoked.  Performed at [facility], [address], [place], [place] [address]       Radiology Studies: No results found.  Scheduled Meds:  escitalopram  5 mg Oral Daily   feeding supplement  1 Container Oral TID BM   heparin injection (subcutaneous)  5,000 Units Subcutaneous Q8H   multivitamin with minerals  1 tablet Oral Daily   nicotine  7 mg Transdermal Daily   Continuous Infusions:  sodium phosphate  Dextrose 5% IVPB 30 mmol (08/31/21 1243)     LOS: 4 days   Time spent: 35 minutes. More than 50% of the time was spent in counseling/coordination of care  Arnetha Courser, MD [facility]  If 7PM-7AM, please contact night-coverage [email or web address]  08/31/2021, 3:50 PM   This record has been created using Conservation officer, historic buildings. Errors have been sought and corrected,but may not always be located. Such creation errors do not reflect on the standard of care.

## 2021-08-31 NOTE — Progress Notes (Signed)
Occupational Therapy Treatment Patient Details Name: Karen Mooney MRN: [id number] DOB: Jun 29, 1961 Today's Date: 08/31/2021    History of present illness 60 y.o. female with medical history significant of recurrent UTIs, tobacco abuse , weight loss.  Family field for involuntary commitment as she apparently has been hardly eating or drinking recently and generalized weakness.  Was seen at [facility] on 27 August for presumed UTI.   OT comments  Upon entering the room, pt supine in bed with RN present giving medications. Pt reports feeling much better this session and feels like she is closer to baseline. Pt declined OOB activity secondary to fatigue. She is agreeable to B UE strengthening HEP with use of yellow theraband. Pt returned demonstrations with min cuing for proper technique. Pt performed 2 sets of 10 chest pulls, alternating punches, shoulder diagonals, shoulder elevation, and bicep curls with rest breaks as needed. Theraband left with pt and she was encouraged to attempt on her own. All needs within reach. Pt continues to benefit from OT intervention.    Follow Up Recommendations  Home health OT;Supervision - Intermittent    Equipment Recommendations  None recommended by OT       Precautions / Restrictions Precautions Precautions: Fall       Mobility Bed Mobility Overal bed mobility: Modified Independent                                Balance Overall balance assessment: Needs assistance Sitting-balance support: No upper extremity supported;Feet supported Sitting balance-Leahy Scale: Normal Sitting balance - Comments: steady sitting reaching outside BOS                                   ADL either performed or assessed with clinical judgement     Vision Patient Visual Report: No change from baseline            Cognition Arousal/Alertness: Awake/alert Behavior During Therapy: WFL for tasks assessed/performed Overall Cognitive Status:  Within Functional Limits for tasks assessed                                 General Comments: Pt pleasant and cooperative overall. She reports feeling much better each day and feeling like she is going to be able to manage at home.                   Pertinent Vitals/ Pain       Pain Assessment: No/denies pain         Frequency  Min 2X/week        Progress Toward Goals  OT Goals(current goals can now be found in the care plan section)  Progress towards OT goals: Progressing toward goals  Acute Rehab OT Goals Patient Stated Goal: Go home OT Goal Formulation: With patient Time For Goal Achievement: 09/13/21 Potential to Achieve Goals: Good  Plan Discharge plan remains appropriate       AM-PAC OT "6 Clicks" Daily Activity     Outcome Measure   Help from another person eating meals?: None Help from another person taking care of personal grooming?: None Help from another person toileting, which includes using toliet, bedpan, or urinal?: A Little Help from another person bathing (including washing, rinsing, drying)?: A Little Help from another person to put on and  taking off regular upper body clothing?: None Help from another person to put on and taking off regular lower body clothing?: A Little 6 Click Score: 21    End of Session    OT Visit Diagnosis: Unsteadiness on feet (R26.81);Muscle weakness (generalized) (M62.81)   Activity Tolerance Patient tolerated treatment well   Patient Left in bed;with call bell/phone within reach;with bed alarm set   Nurse Communication Mobility status        Time: 2248-2500 OT Time Calculation (min): 23 min  Charges: OT General Charges $OT Visit: 1 Visit OT Treatments $Therapeutic Exercise: 23-37 mins  Jackquline Denmark, MS, OTR/L , CBIS ascom [phone]  08/31/21, 3:02 PM

## 2021-08-31 NOTE — Progress Notes (Signed)
Physical Therapy Treatment Patient Details Name: Karen Mooney MRN: [id number] DOB: Jun 23, 1961 Today's Date: 08/31/2021    History of Present Illness 60 y.o. female with medical history significant of recurrent UTIs, tobacco abuse , weight loss.  Family field for involuntary commitment as she apparently has been hardly eating or drinking recently and generalized weakness.  Was seen at [facility] on 27 August for presumed UTI.    PT Comments    Pt resting in bed upon PT arrival; pt agreeable to PT session.  Modified independent with bed mobility; independent with transfers; and CGA ambulating 200 feet (no AD use).  Pt's HR 80-105 bpm during sessions activities.  Overall pt tolerated sessions activities well.  Will continue to focus on strengthening, activity tolerance, and progressive functional mobility during hospitalization; no PT follow-up needs anticipated.    Follow Up Recommendations  No PT follow up     Equipment Recommendations  None recommended by PT    Recommendations for Other Services       Precautions / Restrictions Precautions Precautions: Fall Restrictions Weight Bearing Restrictions: No    Mobility  Bed Mobility Overal bed mobility: Modified Independent             General bed mobility comments: HOB elevated; no difficulties noted    Transfers Overall transfer level: Independent Equipment used: None Transfers: Sit to/from Stand Sit to Stand: Independent         General transfer comment: steady safe transfers from bed noted  Ambulation/Gait Ambulation/Gait assistance: Min guard Gait Distance (Feet): 200 Feet Assistive device: None   Gait velocity: mildly decreased   General Gait Details: steady ambulation; partial step through gait pattern   Stairs             Wheelchair Mobility    Modified Rankin (Stroke Patients Only)       Balance Overall balance assessment: Needs assistance Sitting-balance support: No upper extremity  supported;Feet supported Sitting balance-Leahy Scale: Normal Sitting balance - Comments: steady sitting reaching outside BOS   Standing balance support: No upper extremity supported;During functional activity Standing balance-Leahy Scale: Good Standing balance comment: no loss of balance noted with functional mobility                            Cognition Arousal/Alertness: Awake/alert Behavior During Therapy: WFL for tasks assessed/performed Overall Cognitive Status: Within Functional Limits for tasks assessed                                        Exercises      General Comments  Nursing cleared pt for participation in physical therapy.  Pt agreeable to PT session.      Pertinent Vitals/Pain Pain Assessment: No/denies pain O2 sats WFL on room air during sessions activities.    Home Living                      Prior Function            PT Goals (current goals can now be found in the care plan section) Acute Rehab PT Goals Patient Stated Goal: Go home PT Goal Formulation: With patient Time For Goal Achievement: 09/10/21 Potential to Achieve Goals: Good Progress towards PT goals: Progressing toward goals    Frequency    Min 2X/week      PT Plan Discharge plan  needs to be updated    Co-evaluation              AM-PAC PT "6 Clicks" Mobility   Outcome Measure  Help needed turning from your back to your side while in a flat bed without using bedrails?: None Help needed moving from lying on your back to sitting on the side of a flat bed without using bedrails?: None Help needed moving to and from a bed to a chair (including a wheelchair)?: A Little Help needed standing up from a chair using your arms (e.g., wheelchair or bedside chair)?: A Little Help needed to walk in hospital room?: A Little Help needed climbing 3-5 steps with a railing? : A Little 6 Click Score: 20    End of Session Equipment Utilized During  Treatment: Gait belt Activity Tolerance: Patient tolerated treatment well Patient left: in bed;with call bell/phone within reach;with bed alarm set Nurse Communication: Mobility status;Precautions PT Visit Diagnosis: Muscle weakness (generalized) (M62.81);Unsteadiness on feet (R26.81);Difficulty in walking, not elsewhere classified (R26.2)     Time: 2683-4196 PT Time Calculation (min) (ACUTE ONLY): 18 min  Charges:  $Therapeutic Exercise: 8-22 mins                     Hendricks Limes, PT 08/31/21, 10:26 AM

## 2021-08-31 NOTE — Progress Notes (Signed)
[facility]  ROUNDING NOTE   Subjective:   Patient laying in bed Alert and oriented Tolerating meals No complaints at this time  Objective:  Vital signs in last 24 hours:  Temp:  [97.6 F (36.4 C)-98.4 F (36.9 C)] 97.6 F (36.4 C) (09/08 1158) Pulse Rate:  [76-87] 87 (09/08 1158) Resp:  [18-20] 18 (09/08 1158) BP: (96-116)/(47-67) 107/55 (09/08 1158) SpO2:  [97 %-100 %] 100 % (09/08 1158) Weight:  [50 kg] 50 kg (09/07 1453)  Weight change:  Filed Weights   08/26/21 2148 08/30/21 1453  Weight: 47 kg 50 kg    Intake/Output: I/O last 3 completed shifts: In: 5477.2 [P.O.:408; I.V.:5069.2] Out: 1850 [Urine:1850]   Intake/Output this shift:  Total I/O In: -  Out: 400 [Urine:400]  Physical Exam: General: No acute distress  Head: Normocephalic, atraumatic. Moist oral mucosal membranes  Eyes: Anicteric  Lungs:  Clear to auscultation, normal effort  Heart: S1S2 no rubs  Abdomen:  Soft, nontender, bowel sounds present  Extremities: No peripheral edema.  Neurologic: Awake, alert, following commands  Skin: No acute rash, no lesions       Basic Metabolic Panel: Recent Labs  Lab 08/27/21 0147 08/27/21 0421 08/27/21 1054 08/27/21 1427 08/27/21 2328 08/28/21 0521 08/28/21 1627 08/29/21 0717 08/29/21 1417 08/29/21 2140 08/30/21 0600 08/30/21 1359 08/30/21 2307 08/31/21 0535  NA 104* 106*   < > 106*   < > 107*   < > 113*   < > 117* 121* 123* 126* 127*  K 5.3* 4.8   < > 3.6  --  3.4*  --  3.3*  --   --  3.2*  --   --  4.0  CL 70* 72*   < > 68*  --  66*  --  72*  --   --  88*  --   --  103  CO2 17* 15*   < > 19*  --  25  --  25  --   --  24  --   --  21*  GLUCOSE 116* 112*   < > 133*  --  131*  --  97  --   --  109*  --   --  93  BUN 121* 118*   < > 117*  --  102*  --  90*  --   --  64*  --   --  41*  CREATININE 9.08* 9.15*   < > 8.61*  --  7.24*  --  3.56*  --   --  1.93*  --   --  1.05*  CALCIUM 9.3 9.4   < > 9.1  --  8.9  --  9.2  --   --  8.7*   --   --  7.9*  MG  --  2.0  --   --   --  1.9  --  1.8  --   --  1.7  --   --  1.3*  PHOS 7.2* 6.8*  --   --   --   --   --  3.5  --   --  2.2*  --   --  2.3*   < > = values in this interval not displayed.     Liver Function Tests: Recent Labs  Lab 08/26/21 2231 08/27/21 0421  AST 14* 14*  ALT 8 10  ALKPHOS 108 94  BILITOT 0.8 0.8  PROT 9.5* 8.7*  ALBUMIN 3.9 3.5    Recent Labs  Lab 08/26/21 2231  LIPASE 33    No results for input(s): AMMONIA in the last 168 hours.  CBC: Recent Labs  Lab 08/26/21 2231 08/27/21 0421 08/28/21 0521 08/29/21 0717 08/30/21 0600 08/31/21 0535  WBC 12.3* 12.2* 9.6 6.9 8.3 11.0*  NEUTROABS 10.0* 8.8*  --   --   --   --   HGB 13.7 12.6 12.3 11.8* 10.2* 8.4*  HCT 37.0 33.9* RESULTS UNAVAILABLE DUE TO INTERFERING SUBSTANCE 31.7* 28.0* 24.6*  MCV 89.4 85.8 RESULTS UNAVAILABLE DUE TO INTERFERING SUBSTANCE 86.8 90.6 94.3  PLT 704* 700* 655* 642* 575* 493*     Cardiac Enzymes: Recent Labs  Lab 08/27/21 0147  CKTOTAL 276*     BNP: Invalid input(s): POCBNP  CBG: No results for input(s): GLUCAP in the last 168 hours.  Microbiology: Results for orders placed or performed during the hospital encounter of 08/26/21  Urine Culture     Status: None   Collection Time: 08/27/21 12:06 AM   Specimen: Urine, Clean Catch  Result Value Ref Range Status   Specimen Description   Final    URINE, CLEAN CATCH Performed at [facility], [address] [place], [place] [address]    Special Requests   Final    NONE Performed at [facility], [address], [place], [place] [address]    Culture   Final    NO GROWTH Performed at [facility], [place] [address] [place], [place] [address]    Report Status 08/28/2021 FINAL  Final  Resp Panel by RT-PCR (Flu A&B, Covid) Nasopharyngeal Swab     Status: None   Collection Time: 08/27/21  4:21 AM   Specimen: Nasopharyngeal Swab; Nasopharyngeal(NP) swabs in vial transport  medium  Result Value Ref Range Status   SARS Coronavirus 2 by RT PCR NEGATIVE NEGATIVE Final    Comment: (NOTE) SARS-CoV-2 target nucleic acids are NOT DETECTED.  The SARS-CoV-2 RNA is generally detectable in upper respiratory specimens during the acute phase of infection. The lowest concentration of SARS-CoV-2 viral copies this assay can detect is 138 copies/mL. A negative result does not preclude SARS-Cov-2 infection and should not be used as the sole basis for treatment or other patient management decisions. A negative result may occur with  improper specimen collection/handling, submission of specimen other than nasopharyngeal swab, presence of viral mutation(s) within the areas targeted by this assay, and inadequate number of viral copies(<138 copies/mL). A negative result must be combined with clinical observations, patient history, and epidemiological information. The expected result is Negative.  Fact Sheet for Patients:  EntrepreneurPulse.com.au  Fact Sheet for Healthcare Providers:  IncredibleEmployment.be  This test is no t yet approved or cleared by the [place] FDA and  has been authorized for detection and/or diagnosis of SARS-CoV-2 by FDA under an Emergency Use Authorization (EUA). This EUA will remain  in effect (meaning this test can be used) for the duration of the COVID-19 declaration under Section 564(b)(1) of the Act, 21 U.S.C.section 360bbb-3(b)(1), unless the authorization is terminated  or revoked sooner.       Influenza A by PCR NEGATIVE NEGATIVE Final   Influenza B by PCR NEGATIVE NEGATIVE Final    Comment: (NOTE) The Xpert Xpress SARS-CoV-2/FLU/RSV plus assay is intended as an aid in the diagnosis of influenza from Nasopharyngeal swab specimens and should not be used as a sole basis for treatment. Nasal washings and aspirates are unacceptable for Xpert Xpress SARS-CoV-2/FLU/RSV testing.  Fact Sheet for  Patients: EntrepreneurPulse.com.au  Fact Sheet for Healthcare Providers: IncredibleEmployment.be  This test is not yet approved or cleared by the [place] FDA and has been authorized for detection and/or diagnosis of SARS-CoV-2 by FDA under an Emergency Use Authorization (EUA). This EUA will remain in effect (meaning this test can be used) for the duration of the COVID-19 declaration under Section 564(b)(1) of the Act, 21 U.S.C. section 360bbb-3(b)(1), unless the authorization is terminated or revoked.  Performed at [facility], [address] [place], [place] [address]     Coagulation Studies: No results for input(s): LABPROT, INR in the last 72 hours.   Urinalysis: No results for input(s): COLORURINE, LABSPEC, PHURINE, GLUCOSEU, HGBUR, BILIRUBINUR, KETONESUR, PROTEINUR, UROBILINOGEN, NITRITE, LEUKOCYTESUR in the last 72 hours.  Invalid input(s): APPERANCEUR     Imaging: No results found.   Medications:    sodium phosphate  Dextrose 5% IVPB 30 mmol (08/31/21 1243)    escitalopram  5 mg Oral Daily   feeding supplement  1 Container Oral TID BM   heparin injection (subcutaneous)  5,000 Units Subcutaneous Q8H   multivitamin with minerals  1 tablet Oral Daily   nicotine  7 mg Transdermal Daily   acetaminophen **OR** acetaminophen, metoCLOPramide (REGLAN) injection, Muscle Rub  Assessment/ Plan:  60 y.o. female with past medical history of frequent urinary tract infections, tobacco abuse, prior history of proteinuria, history of mild to moderate hydronephrosis who was admitted with severe acute kidney injury and hyponatremia.  1.  Acute kidney injury/hematuria/proteinuria.  The patient has had proteinuria going back to August of last year.  Also had hematuria then as well.  We will proceed with additional work-up including SPEP, ANA, ANCA antibodies, GBM antibodies. Unclear as to what role mild to moderate hydronephrosis  playing as there is some chronicity to this.  No acute need for dialysis. Renal function improved. Continue IVF for now.     2.  Hyponatremia/hypokalemia.  Serum sodium coming up albeit very slowly.  Sodium 127. Continue 0.9NS @ 131ml/hr. Oral potassium supplementation as ordered. Goal Na 130. Would be agreeable to discharge at this level.  3.  Mild to moderate bilateral hydronephrosis.  Appears that this has some element of chronicity.  Most recent imaging actually showed improvement from prior imaging studies in terms of the degree of hydronephrosis. Recommend Urology to further workup   LOS: [place] 9/8/20221:20 PM

## 2021-09-01 LAB — BASIC METABOLIC PANEL
Anion gap: 5 (ref 5–15)
BUN: 33 mg/dL — ABNORMAL HIGH (ref 6–20)
CO2: 22 mmol/L (ref 22–32)
Calcium: 8.2 mg/dL — ABNORMAL LOW (ref 8.9–10.3)
Chloride: 103 mmol/L (ref 98–111)
Creatinine, Ser: 1.19 mg/dL — ABNORMAL HIGH (ref 0.44–1.00)
GFR, Estimated: 52 mL/min — ABNORMAL LOW (ref >60)
Glucose, Bld: 97 mg/dL (ref 70–99)
Potassium: 3.8 mmol/L (ref 3.5–5.1)
Sodium: 130 mmol/L — ABNORMAL LOW (ref 135–145)

## 2021-09-01 LAB — CBC
HCT: 25.7 % — ABNORMAL LOW (ref 36.0–46.0)
Hemoglobin: 8.8 g/dL — ABNORMAL LOW (ref 12.0–15.0)
MCH: 33 pg (ref 26.0–34.0)
MCHC: 34.2 g/dL (ref 30.0–36.0)
MCV: 96.3 fL (ref 80.0–100.0)
Platelets: 479 10*3/uL — ABNORMAL HIGH (ref 150–400)
RBC: 2.67 MIL/uL — ABNORMAL LOW (ref 3.87–5.11)
RDW: 12.4 % (ref 11.5–15.5)
WBC: 13.8 10*3/uL — ABNORMAL HIGH (ref 4.0–10.5)
nRBC: 0 % (ref 0.0–0.2)

## 2021-09-01 LAB — MAGNESIUM: Magnesium: 1.4 mg/dL — ABNORMAL LOW (ref 1.7–2.4)

## 2021-09-01 LAB — VITAMIN B1: Vitamin B1 (Thiamine): 56.4 nmol/L — ABNORMAL LOW (ref 66.5–200.0)

## 2021-09-01 LAB — PHOSPHORUS: Phosphorus: 3.1 mg/dL (ref 2.5–4.6)

## 2021-09-01 LAB — PROCALCITONIN: Procalcitonin: <0.1 ng/mL

## 2021-09-01 LAB — ANA W/REFLEX IF POSITIVE: Anti Nuclear Antibody (ANA): NEGATIVE

## 2021-09-01 LAB — SODIUM: Sodium: 130 mmol/L — ABNORMAL LOW (ref 135–145)

## 2021-09-01 MED ORDER — MAGNESIUM SULFATE 4 GM/100ML IV SOLN
4.0000 g | Freq: Once | INTRAVENOUS | Status: AC
  Administered 2021-09-01: 4 g via INTRAVENOUS
  Filled 2021-09-01: qty 100

## 2021-09-01 MED ORDER — ADULT MULTIVITAMIN W/MINERALS CH: 1.0000 | ORAL_TABLET | Freq: Every day | ORAL | 1 refills | Status: DC

## 2021-09-01 NOTE — Plan of Care (Signed)

## 2021-09-01 NOTE — Progress Notes (Signed)
Discharge instructions reviewed and all questions answered at this time. Patient ready for discharge home via [facility] by private vehicle. Follow up appointments in place at this time. Vitals WDL at time of discharge. All needs met for patient. Unsuccessful contact to brother at this time; 2 unanswered calls. Will continue to contact him to pick patient up to return home.

## 2021-09-01 NOTE — Discharge Summary (Signed)
Physician Discharge Summary  Karen Mooney WUJ:[id number] DOB: 01/12/1961 DOA: 08/26/2021  PCP: Patient, No Pcp Per (Inactive)  Admit date: 08/26/2021 Discharge date: 09/01/2021  Admitted From: Home Disposition: Home  Recommendations for Outpatient Follow-up:  Follow up with PCP in 1-2 weeks Follow-up with urology  Please obtain BMP/CBC in one week Please follow up on the following pending results: None  Home Health: No Equipment/Devices: None Discharge Condition: Stable CODE STATUS: Full Diet recommendation:  Regular   Brief/Interim Summary: Karen Mooney is a 60 y.o. female with medical history significant of recurrent UTIs, tobacco abuse, weight loss whose family filed for IVC and brought pt to the hospital.   Family has been concerned that patient has been losing weight and not eating.  [facility] August for presumed UTI was given prescription for Bactrim and Pyridium UA at the time showed persistent infection.  Urine cultures negative.   Found to have severe hyponatremia with sodium of 103 on presentation.  Initially requiring hypertonic saline, later transition to normal saline.  Sodium gradually improved.  It was thought to be due to decreased p.o. intake and psychogenic polydipsia as she was drinking a lot of water.  She had a sodium of 130 on the day of discharge.  She needs a repeat BMP in 1 week to see if it continues to go in the right direction.  She also developed some leukocytosis a day prior to discharge.  No obvious source of infection.  Procalcitonin remain negative.  No upper respiratory or urinary symptoms.  No fever or chills. She will need a repeat CBC next week by her PCP.  She did had some electrolyte abnormalities which include hypokalemia, hypophosphatemia and hypomagnesemia which were repleted during hospitalization.  Patient has an history of chronic bilateral hydronephrosis/bilateral nonobstructing nephrolithiasis.  Patient remained  asymptomatic and needs to follow-up with urology as an outpatient.  Repeat CT scan with mild improvement in bilateral Hydro nephrosis.  Because of her history of poor p.o. intake and weight loss, dietitian was consulted and she was found to have moderate to severe protein caloric malnutrition.  Dietary supplements were recommended. If she continued to lose weight after improvement in her p.o. intake she will need an outpatient oncologic work-up which can be arranged from his PCP.  Discharge Diagnoses:  Principal Problem:   Hyponatremia Active Problems:   AKI (acute kidney injury) (HCC)   Hematuria   Weight loss   Malnutrition due to starvation (HCC)   Hyperkalemia   Tobacco abuse   Nausea & vomiting   Depression   Adjustment disorder   Protein-calorie malnutrition, severe   Discharge Instructions  Discharge Instructions     Diet - low sodium heart healthy   Complete by: As directed    Discharge instructions   Complete by: As directed    It was pleasure taking care of you. As we discussed you need to see a primary care doctor and have your labs checked in 1 week. Restrict your free water intake.   Increase activity slowly   Complete by: As directed       Allergies as of 09/01/2021   No Known Allergies      Medication List     STOP taking these medications    ondansetron 4 MG disintegrating tablet Commonly known as: ZOFRAN-ODT   sulfamethoxazole-trimethoprim 800-160 MG tablet Commonly known as: BACTRIM DS       TAKE these medications    multivitamin with minerals Tabs tablet  Take 1 tablet by mouth daily. Start taking on: September 02, 2021        No Known Allergies  Consultations: Nephrology  Procedures/Studies: CT ABDOMEN PELVIS WO CONTRAST  Result Date: 08/27/2021 CLINICAL DATA:  Abdominal pain EXAM: CT ABDOMEN AND PELVIS WITHOUT CONTRAST TECHNIQUE: Multidetector CT imaging of the abdomen and pelvis was performed following the standard protocol  without IV contrast. COMPARISON:  [id number] FINDINGS: Lower chest: No acute abnormality Hepatobiliary: No focal hepatic abnormality. Gallbladder unremarkable. Pancreas: No focal abnormality or ductal dilatation. Spleen: No focal abnormality.  Normal size. Adrenals/Urinary Tract: Numerous bilateral nonobstructing stones. There is mild to moderate bilateral hydronephrosis, decreased since prior study, left greater than right. Bilateral nephrolithiasis. No ureteral stones. No adrenal mass. Urinary bladder unremarkable. Stomach/Bowel: Stomach, large and small bowel grossly unremarkable. Vascular/Lymphatic: Aortic atherosclerosis. No evidence of aortic aneurysm. No adenopathy. Reproductive: Prior hysterectomy.  No adnexal masses. Other: No free fluid or free air. Musculoskeletal: No acute bony abnormality. 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 IMPRESSION: Chronic mild to moderate bilateral hydronephrosis has improved since prior study. No ureteral stones. Bilateral nephrolithiasis. Aortic atherosclerosis. No acute findings. Electronically Signed   By: Charlett Nose M.D.   On: 08/27/2021 00:44   DG Chest 2 View  Result Date: 08/27/2021 CLINICAL DATA:  Weakness, anorexia EXAM: CHEST - 2 VIEW COMPARISON:  None. FINDINGS: The heart size and mediastinal contours are within normal limits. Both lungs are clear. The visualized skeletal structures are unremarkable. IMPRESSION: No active cardiopulmonary disease. Electronically Signed   By: Helyn Numbers M.D.   On: 08/27/2021 00:47   CT HEAD WO CONTRAST ( )  Result Date: 08/27/2021 CLINICAL DATA:  Delirium EXAM: CT HEAD WITHOUT CONTRAST TECHNIQUE: Contiguous axial images were obtained from the base of the skull through the vertex without intravenous contrast. COMPARISON:  None. FINDINGS: Brain: Old left cerebellar infarct. Mild diffuse cerebral atrophy. No acute intracranial abnormality. Specifically, no hemorrhage,  hydrocephalus, mass lesion, acute infarction, or significant intracranial injury. Vascular: No hyperdense vessel or unexpected calcification. Skull: No acute calvarial abnormality. Sinuses/Orbits: No acute findings Other: None IMPRESSION: Old left cerebellar infarct. No acute intracranial abnormality. Electronically Signed   By: Charlett Nose M.D.   On: 08/27/2021 02:09    Subjective: Patient was seen and examined today.  No new complaints.  We discussed about getting established with a PCP for regular monitoring.  Discharge Exam: Vitals:   09/01/21 0812 09/01/21 1159  BP: 107/60 (!) 122/56  Pulse: 77 96  Resp: 18 18  Temp: 97.9 F (36.6 C) 97.9 F (36.6 C)  SpO2: 100% 100%   Vitals:   09/01/21 0053 09/01/21 0542 09/01/21 0812 09/01/21 1159  BP: 135/61 127/62 107/60 (!) 122/56  Pulse: 91 88 77 96  Resp: 20 20 18 18   Temp: 98.2 F (36.8 C) 99.4 F (37.4 C) 97.9 F (36.6 C) 97.9 F (36.6 C)  TempSrc:  Oral  Oral  SpO2: 99% 98% 100% 100%  Weight:      Height:        General: Pt is alert, awake, not in acute distress Cardiovascular: RRR, S1/S2 +, no rubs, no gallops Respiratory: CTA bilaterally, no wheezing, no rhonchi Abdominal: Soft, NT, ND, bowel sounds + Extremities: no edema, no cyanosis   The results of significant diagnostics from this hospitalization (including imaging, microbiology, ancillary and laboratory) are listed below for reference.    Microbiology: Recent Results (from the past 240 hour(s))  Urine Culture  Status: None   Collection Time: 08/27/21 12:06 AM   Specimen: Urine, Clean Catch  Result Value Ref Range Status   Specimen Description   Final    URINE, CLEAN CATCH Performed at [facility], [address], [place], [place] [address]    Special Requests   Final    NONE Performed at [facility], [address], [place], [place] [address]    Culture   Final    NO GROWTH Performed at [facility], [address],  [place], [place] [address]    Report Status 08/28/2021 FINAL  Final  Resp Panel by RT-PCR (Flu A&B, Covid) Nasopharyngeal Swab     Status: None   Collection Time: 08/27/21  4:21 AM   Specimen: Nasopharyngeal Swab; Nasopharyngeal(NP) swabs in vial transport medium  Result Value Ref Range Status   SARS Coronavirus 2 by RT PCR NEGATIVE NEGATIVE Final    Comment: (NOTE) SARS-CoV-2 target nucleic acids are NOT DETECTED.  The SARS-CoV-2 RNA is generally detectable in upper respiratory specimens during the acute phase of infection. The lowest concentration of SARS-CoV-2 viral copies this assay can detect is 138 copies/mL. A negative result does not preclude SARS-Cov-2 infection and should not be used as the sole basis for treatment or other patient management decisions. A negative result may occur with  improper specimen collection/handling, submission of specimen other than nasopharyngeal swab, presence of viral mutation(s) within the areas targeted by this assay, and inadequate number of viral copies(<138 copies/mL). A negative result must be combined with clinical observations, patient history, and epidemiological information. The expected result is Negative.  Fact Sheet for Patients:  BloggerCourse.com  Fact Sheet for Healthcare Providers:  SeriousBroker.it  This test is no t yet approved or cleared by the [place] FDA and  has been authorized for detection and/or diagnosis of SARS-CoV-2 by FDA under an Emergency Use Authorization (EUA). This EUA will remain  in effect (meaning this test can be used) for the duration of the COVID-19 declaration under Section 564(b)(1) of the Act, 21 U.S.C.section 360bbb-3(b)(1), unless the authorization is terminated  or revoked sooner.       Influenza A by PCR NEGATIVE NEGATIVE Final   Influenza B by PCR NEGATIVE NEGATIVE Final    Comment: (NOTE) The Xpert Xpress SARS-CoV-2/FLU/RSV plus assay is  intended as an aid in the diagnosis of influenza from Nasopharyngeal swab specimens and should not be used as a sole basis for treatment. Nasal washings and aspirates are unacceptable for Xpert Xpress SARS-CoV-2/FLU/RSV testing.  Fact Sheet for Patients: BloggerCourse.com  Fact Sheet for Healthcare Providers: SeriousBroker.it  This test is not yet approved or cleared by the [place] FDA and has been authorized for detection and/or diagnosis of SARS-CoV-2 by FDA under an Emergency Use Authorization (EUA). This EUA will remain in effect (meaning this test can be used) for the duration of the COVID-19 declaration under Section 564(b)(1) of the Act, 21 U.S.C. section 360bbb-3(b)(1), unless the authorization is terminated or revoked.  Performed at [facility] [place], [address] [place], [place] [address]      Labs: BNP (last 3 results) No results for input(s): BNP in the last 8760 hours. Basic Metabolic Panel: Recent Labs  Lab 08/27/21 0421 08/27/21 1054 08/28/21 0521 08/28/21 1627 08/29/21 0717 08/29/21 1417 08/30/21 0600 08/30/21 1359 08/30/21 2307 08/31/21 0535 08/31/21 1357 08/31/21 2153 09/01/21 0557  NA 106*   < > 107*   < > 113*   < > 121*   < >  126* 127* 128* 129* 130*  K 4.8   < > 3.4*  --  3.3*  --  3.2*  --   --  4.0  --   --  3.8  CL 72*   < > 66*  --  72*  --  88*  --   --  103  --   --  103  CO2 15*   < > 25  --  25  --  24  --   --  21*  --   --  22  GLUCOSE 112*   < > 131*  --  97  --  109*  --   --  93  --   --  97  BUN 118*   < > 102*  --  90*  --  64*  --   --  41*  --   --  33*  CREATININE 9.15*   < > 7.24*  --  3.56*  --  1.93*  --   --  1.05*  --   --  1.19*  CALCIUM 9.4   < > 8.9  --  9.2  --  8.7*  --   --  7.9*  --   --  8.2*  MG 2.0  --  1.9  --  1.8  --  1.7  --   --  1.3*  --   --  1.4*  PHOS 6.8*  --   --   --  3.5  --  2.2*  --   --  2.3*  --   --  3.1   < > = values in this  interval not displayed.   Liver Function Tests: Recent Labs  Lab 08/26/21 2231 08/27/21 0421  AST 14* 14*  ALT 8 10  ALKPHOS 108 94  BILITOT 0.8 0.8  PROT 9.5* 8.7*  ALBUMIN 3.9 3.5   Recent Labs  Lab 08/26/21 2231  LIPASE 33   No results for input(s): AMMONIA in the last 168 hours. CBC: Recent Labs  Lab 08/26/21 2231 08/27/21 0421 08/28/21 0521 08/29/21 0717 08/30/21 0600 08/31/21 0535 09/01/21 0557  WBC 12.3* 12.2* 9.6 6.9 8.3 11.0* 13.8*  NEUTROABS 10.0* 8.8*  --   --   --   --   --   HGB 13.7 12.6 12.3 11.8* 10.2* 8.4* 8.8*  HCT 37.0 33.9* RESULTS UNAVAILABLE DUE TO INTERFERING SUBSTANCE 31.7* 28.0* 24.6* 25.7*  MCV 89.4 85.8 RESULTS UNAVAILABLE DUE TO INTERFERING SUBSTANCE 86.8 90.6 94.3 96.3  PLT 704* 700* 655* 642* 575* 493* 479*   Cardiac Enzymes: Recent Labs  Lab 08/27/21 0147  CKTOTAL 276*   BNP: Invalid input(s): POCBNP CBG: No results for input(s): GLUCAP in the last 168 hours. D-Dimer No results for input(s): DDIMER in the last 72 hours. Hgb A1c No results for input(s): HGBA1C in the last 72 hours. Lipid Profile No results for input(s): CHOL, HDL, LDLCALC, TRIG, CHOLHDL, LDLDIRECT in the last 72 hours. Thyroid function studies No results for input(s): TSH, T4TOTAL, T3FREE, THYROIDAB in the last 72 hours.  Invalid input(s): FREET3 Anemia work up No results for input(s): VITAMINB12, FOLATE, FERRITIN, TIBC, IRON, RETICCTPCT in the last 72 hours. Urinalysis    Component Value Date/Time   COLORURINE YELLOW 08/27/2021 0006   APPEARANCEUR CLOUDY (A) 08/27/2021 0006   APPEARANCEUR Hazy 09/22/2012 1315   LABSPEC 1.020 08/27/2021 0006   LABSPEC 1.002 09/22/2012 1315   PHURINE 6.0 08/27/2021 0006   GLUCOSEU NEGATIVE 08/27/2021 0006  GLUCOSEU Negative 09/22/2012 1315   HGBUR LARGE (A) 08/27/2021 0006   BILIRUBINUR NEGATIVE 08/27/2021 0006   BILIRUBINUR Negative 09/22/2012 1315   KETONESUR TRACE (A) 08/27/2021 0006   PROTEINUR >300 (A)  08/27/2021 0006   NITRITE NEGATIVE 08/27/2021 0006   LEUKOCYTESUR LARGE (A) 08/27/2021 0006   LEUKOCYTESUR 2+ 09/22/2012 1315   Sepsis Labs Invalid input(s): PROCALCITONIN,  WBC,  LACTICIDVEN Microbiology Recent Results (from the past 240 hour(s))  Urine Culture     Status: None   Collection Time: 08/27/21 12:06 AM   Specimen: Urine, Clean Catch  Result Value Ref Range Status   Specimen Description   Final    URINE, CLEAN CATCH Performed at [facility], [address] [place], [place] [address]    Special Requests   Final    NONE Performed at [facility], [address], [place], [place] [address]    Culture   Final    NO GROWTH Performed at [facility], [address] [place], [place] [address]    Report Status 08/28/2021 FINAL  Final  Resp Panel by RT-PCR (Flu A&B, Covid) Nasopharyngeal Swab     Status: None   Collection Time: 08/27/21  4:21 AM   Specimen: Nasopharyngeal Swab; Nasopharyngeal(NP) swabs in vial transport medium  Result Value Ref Range Status   SARS Coronavirus 2 by RT PCR NEGATIVE NEGATIVE Final    Comment: (NOTE) SARS-CoV-2 target nucleic acids are NOT DETECTED.  The SARS-CoV-2 RNA is generally detectable in upper respiratory specimens during the acute phase of infection. The lowest concentration of SARS-CoV-2 viral copies this assay can detect is 138 copies/mL. A negative result does not preclude SARS-Cov-2 infection and should not be used as the sole basis for treatment or other patient management decisions. A negative result may occur with  improper specimen collection/handling, submission of specimen other than nasopharyngeal swab, presence of viral mutation(s) within the areas targeted by this assay, and inadequate number of viral copies(<138 copies/mL). A negative result must be combined with clinical observations, patient history, and epidemiological information. The expected result is Negative.  Fact Sheet for  Patients:  BloggerCourse.com  Fact Sheet for Healthcare Providers:  SeriousBroker.it  This test is no t yet approved or cleared by the [place] FDA and  has been authorized for detection and/or diagnosis of SARS-CoV-2 by FDA under an Emergency Use Authorization (EUA). This EUA will remain  in effect (meaning this test can be used) for the duration of the COVID-19 declaration under Section 564(b)(1) of the Act, 21 U.S.C.section 360bbb-3(b)(1), unless the authorization is terminated  or revoked sooner.       Influenza A by PCR NEGATIVE NEGATIVE Final   Influenza B by PCR NEGATIVE NEGATIVE Final    Comment: (NOTE) The Xpert Xpress SARS-CoV-2/FLU/RSV plus assay is intended as an aid in the diagnosis of influenza from Nasopharyngeal swab specimens and should not be used as a sole basis for treatment. Nasal washings and aspirates are unacceptable for Xpert Xpress SARS-CoV-2/FLU/RSV testing.  Fact Sheet for Patients: BloggerCourse.com  Fact Sheet for Healthcare Providers: SeriousBroker.it  This test is not yet approved or cleared by the [place] FDA and has been authorized for detection and/or diagnosis of SARS-CoV-2 by FDA under an Emergency Use Authorization (EUA). This EUA will remain in effect (meaning this test can be used) for the duration of the COVID-19 declaration under Section 564(b)(1) of the Act, 21 U.S.C. section 360bbb-3(b)(1), unless the authorization is terminated or revoked.  Performed  at [facility], [address], [place], [place] [address]     Time coordinating discharge: Over 30 minutes  SIGNED:  Arnetha Courser, MD  [facility] 09/01/2021, 1:26 PM  If 7PM-7AM, please contact night-coverage [email or web address]  This record has been created using Conservation officer, historic buildings. Errors have been sought and corrected,but may not  always be located. Such creation errors do not reflect on the standard of care.

## 2021-09-01 NOTE — Progress Notes (Signed)
[facility]  ROUNDING NOTE   Subjective:   Patient seen sitting up in bed Alert, oriented, smiling Tolerating meals, denies nausea and vomiting Denies shortness of breath  Objective:  Vital signs in last 24 hours:  Temp:  [97.9 F (36.6 C)-99.4 F (37.4 C)] 97.9 F (36.6 C) (09/09 1159) Pulse Rate:  [77-97] 96 (09/09 1159) Resp:  [18-20] 18 (09/09 1159) BP: (107-135)/(53-62) 122/56 (09/09 1159) SpO2:  [98 %-100 %] 100 % (09/09 1159)  Weight change:  Filed Weights   08/26/21 2148 08/30/21 1453  Weight: 47 kg 50 kg    Intake/Output: I/O last 3 completed shifts: In: 2919.7 [P.O.:260; I.V.:2659.7] Out: 1100 [Urine:1100]   Intake/Output this shift:  Total I/O In: 240 [P.O.:240] Out: -   Physical Exam: General: No acute distress  Head: Normocephalic, atraumatic. Moist oral mucosal membranes  Eyes: Anicteric  Lungs:  Clear to auscultation, normal effort  Heart: S1S2 no rubs  Abdomen:  Soft, nontender, bowel sounds present  Extremities: No peripheral edema.  Neurologic: Awake, alert, following commands  Skin: No acute rash, no lesions       Basic Metabolic Panel: Recent Labs  Lab 08/27/21 0421 08/27/21 1054 08/28/21 0521 08/28/21 1627 08/29/21 0717 08/29/21 1417 08/30/21 0600 08/30/21 1359 08/31/21 0535 08/31/21 1357 08/31/21 2153 09/01/21 0557 09/01/21 1401  NA 106*   < > 107*   < > 113*   < > 121*   < > 127* 128* 129* 130* 130*  K 4.8   < > 3.4*  --  3.3*  --  3.2*  --  4.0  --   --  3.8  --   CL 72*   < > 66*  --  72*  --  88*  --  103  --   --  103  --   CO2 15*   < > 25  --  25  --  24  --  21*  --   --  22  --   GLUCOSE 112*   < > 131*  --  97  --  109*  --  93  --   --  97  --   BUN 118*   < > 102*  --  90*  --  64*  --  41*  --   --  33*  --   CREATININE 9.15*   < > 7.24*  --  3.56*  --  1.93*  --  1.05*  --   --  1.19*  --   CALCIUM 9.4   < > 8.9  --  9.2  --  8.7*  --  7.9*  --   --  8.2*  --   MG 2.0  --  1.9  --  1.8  --  1.7   --  1.3*  --   --  1.4*  --   PHOS 6.8*  --   --   --  3.5  --  2.2*  --  2.3*  --   --  3.1  --    < > = values in this interval not displayed.     Liver Function Tests: Recent Labs  Lab 08/26/21 2231 08/27/21 0421  AST 14* 14*  ALT 8 10  ALKPHOS 108 94  BILITOT 0.8 0.8  PROT 9.5* 8.7*  ALBUMIN 3.9 3.5    Recent Labs  Lab 08/26/21 2231  LIPASE 33    No results for input(s): AMMONIA in the last 168 hours.  CBC: Recent Labs  Lab 08/26/21 2231 08/27/21 0421 08/28/21 0521 08/29/21 0717 08/30/21 0600 08/31/21 0535 09/01/21 0557  WBC 12.3* 12.2* 9.6 6.9 8.3 11.0* 13.8*  NEUTROABS 10.0* 8.8*  --   --   --   --   --   HGB 13.7 12.6 12.3 11.8* 10.2* 8.4* 8.8*  HCT 37.0 33.9* RESULTS UNAVAILABLE DUE TO INTERFERING SUBSTANCE 31.7* 28.0* 24.6* 25.7*  MCV 89.4 85.8 RESULTS UNAVAILABLE DUE TO INTERFERING SUBSTANCE 86.8 90.6 94.3 96.3  PLT 704* 700* 655* 642* 575* 493* 479*     Cardiac Enzymes: Recent Labs  Lab 08/27/21 0147  CKTOTAL 276*     BNP: Invalid input(s): POCBNP  CBG: No results for input(s): GLUCAP in the last 168 hours.  Microbiology: Results for orders placed or performed during the hospital encounter of 08/26/21  Urine Culture     Status: None   Collection Time: 08/27/21 12:06 AM   Specimen: Urine, Clean Catch  Result Value Ref Range Status   Specimen Description   Final    URINE, CLEAN CATCH Performed at [facility], [address], [place], [place] [address]    Special Requests   Final    NONE Performed at [facility], [address] [place], [place] [address]    Culture   Final    NO GROWTH Performed at [facility], [place] [address] [place], [place] [address]    Report Status 08/28/2021 FINAL  Final  Resp Panel by RT-PCR (Flu A&B, Covid) Nasopharyngeal Swab     Status: None   Collection Time: 08/27/21  4:21 AM   Specimen: Nasopharyngeal Swab; Nasopharyngeal(NP) swabs in vial transport medium  Result  Value Ref Range Status   SARS Coronavirus 2 by RT PCR NEGATIVE NEGATIVE Final    Comment: (NOTE) SARS-CoV-2 target nucleic acids are NOT DETECTED.  The SARS-CoV-2 RNA is generally detectable in upper respiratory specimens during the acute phase of infection. The lowest concentration of SARS-CoV-2 viral copies this assay can detect is 138 copies/mL. A negative result does not preclude SARS-Cov-2 infection and should not be used as the sole basis for treatment or other patient management decisions. A negative result may occur with  improper specimen collection/handling, submission of specimen other than nasopharyngeal swab, presence of viral mutation(s) within the areas targeted by this assay, and inadequate number of viral copies(<138 copies/mL). A negative result must be combined with clinical observations, patient history, and epidemiological information. The expected result is Negative.  Fact Sheet for Patients:  EntrepreneurPulse.com.au  Fact Sheet for Healthcare Providers:  IncredibleEmployment.be  This test is no t yet approved or cleared by the [place] FDA and  has been authorized for detection and/or diagnosis of SARS-CoV-2 by FDA under an Emergency Use Authorization (EUA). This EUA will remain  in effect (meaning this test can be used) for the duration of the COVID-19 declaration under Section 564(b)(1) of the Act, 21 U.S.C.section 360bbb-3(b)(1), unless the authorization is terminated  or revoked sooner.       Influenza A by PCR NEGATIVE NEGATIVE Final   Influenza B by PCR NEGATIVE NEGATIVE Final    Comment: (NOTE) The Xpert Xpress SARS-CoV-2/FLU/RSV plus assay is intended as an aid in the diagnosis of influenza from Nasopharyngeal swab specimens and should not be used as a sole basis for treatment. Nasal washings and aspirates are unacceptable for Xpert Xpress SARS-CoV-2/FLU/RSV testing.  Fact Sheet for  Patients: EntrepreneurPulse.com.au  Fact Sheet for Healthcare Providers: IncredibleEmployment.be  This test  is not yet approved or cleared by the [place] and has been authorized for detection and/or diagnosis of SARS-CoV-2 by FDA under an Emergency Use Authorization (EUA). This EUA will remain in effect (meaning this test can be used) for the duration of the COVID-19 declaration under Section 564(b)(1) of the Act, 21 U.S.C. section 360bbb-3(b)(1), unless the authorization is terminated or revoked.  Performed at [facility], [address] [place], [place] [address]     Coagulation Studies: No results for input(s): LABPROT, INR in the last 72 hours.   Urinalysis: No results for input(s): COLORURINE, LABSPEC, PHURINE, GLUCOSEU, HGBUR, BILIRUBINUR, KETONESUR, PROTEINUR, UROBILINOGEN, NITRITE, LEUKOCYTESUR in the last 72 hours.  Invalid input(s): APPERANCEUR     Imaging: No results found.   Medications:      escitalopram  5 mg Oral Daily   feeding supplement  1 Container Oral TID BM   heparin injection (subcutaneous)  5,000 Units Subcutaneous Q8H   multivitamin with minerals  1 tablet Oral Daily   nicotine  7 mg Transdermal Daily   acetaminophen **OR** acetaminophen, metoCLOPramide (REGLAN) injection, Muscle Rub  Assessment/ Plan:  61 y.o. female with past medical history of frequent urinary tract infections, tobacco abuse, prior history of proteinuria, history of mild to moderate hydronephrosis who was admitted with severe acute kidney injury and hyponatremia.  1.  Acute kidney injury/hematuria/proteinuria.  The patient has had proteinuria going back to August of last year.  Also had hematuria then as well.  We will proceed with additional work-up including SPEP, ANA, ANCA antibodies, GBM antibodies. Unclear as to what role mild to moderate hydronephrosis playing as there is some chronicity to this.  No acute need  for dialysis. Creatinine improved to 1.2.      2.  Hyponatremia/hypokalemia.  Serum sodium coming up albeit very slowly.  Sodium 130 today, at target. IVF discontinued.  Potassium stable. Agreeable to discharge.  3.  Mild to moderate bilateral hydronephrosis.  Appears that this has some element of chronicity.  Most recent imaging actually showed improvement from prior imaging studies in terms of the degree of hydronephrosis. Recommend Urology to further workup   LOS: 5 Toco 9/9/20223:17 PM

## 2021-10-03 LAB — BLOOD GAS, VENOUS
Acid-base deficit: 9.6 mmol/L — ABNORMAL HIGH (ref 0.0–2.0)
Bicarbonate: 14.6 mmol/L — ABNORMAL LOW (ref 20.0–28.0)
O2 Saturation: 77.5 %
Patient temperature: 37
pCO2, Ven: 27 mmHg — ABNORMAL LOW (ref 44.0–60.0)
pH, Ven: 7.34 (ref 7.250–7.430)
pO2, Ven: 45 mmHg (ref 32.0–45.0)

## 2022-05-23 ENCOUNTER — Other Ambulatory Visit: Payer: Self-pay

## 2022-05-23 DIAGNOSIS — Z1211 Encounter for screening for malignant neoplasm of colon: Secondary | ICD-10-CM

## 2022-05-23 DIAGNOSIS — Z1231 Encounter for screening mammogram for malignant neoplasm of breast: Secondary | ICD-10-CM

## 2022-05-29 ENCOUNTER — Ambulatory Visit: Payer: Self-pay | Attending: Hematology and Oncology | Admitting: *Deleted

## 2022-05-29 ENCOUNTER — Ambulatory Visit
Admission: RE | Admit: 2022-05-29 | Discharge: 2022-05-29 | Disposition: A | Discharge status: Home / Self Care | Payer: Self-pay | Source: Ambulatory Visit | Attending: Obstetrics and Gynecology | Admitting: Obstetrics and Gynecology

## 2022-05-29 ENCOUNTER — Telehealth: Payer: Self-pay | Admitting: *Deleted

## 2022-05-29 ENCOUNTER — Encounter: Payer: Self-pay | Admitting: *Deleted

## 2022-05-29 ENCOUNTER — Encounter (INDEPENDENT_AMBULATORY_CARE_PROVIDER_SITE_OTHER): Payer: Self-pay

## 2022-05-29 VITALS — BP 126/77 | Wt 97.4 lb

## 2022-05-29 DIAGNOSIS — Z01419 Encounter for gynecological examination (general) (routine) without abnormal findings: Secondary | ICD-10-CM

## 2022-05-29 DIAGNOSIS — Z1231 Encounter for screening mammogram for malignant neoplasm of breast: Secondary | ICD-10-CM | POA: Insufficient documentation

## 2022-05-29 NOTE — Progress Notes (Signed)
[name] is a 61 y.o. female who presents to [facility] clinic today with complaint of chronic UTI's.  States she treats the UTI's with cranberry juice with success.  Informed patient to discuss the UTI's with her PCP.  Informed patient that sometimes post menopausal women have difficulty with chronic UTI's due to lack of estrogen.  Encouraged her to discuss with her PCP if a prescription would be appropriate for her.  She is here today for a well woman visit.      Pap Smear: Pap not smear completed today. Last Pap smear is unknown.   History of a hysterectomy in [place] about 30 years ago. Last Pap smear result is not available in Epic.   Physical exam: Breasts Breasts symmetrical. No skin abnormalities bilateral breasts. No nipple retraction bilateral breasts. No nipple discharge bilateral breasts. No lymphadenopathy. No lumps palpated bilateral breasts.       Pelvic/Bimanual Pap is not indicated today    Smoking History: Patient has is a current smoker at 1 packs per day. Refused referral to quit line.    Patient Navigation: Patient education provided. Access to services provided for patient through [facility] program. No interpreter provided. No transportation provided   Colorectal Cancer Screening: Patient had a positive FIT test at the [facility] on 11/21/21.  Patient states she is not aware of the results and has not been given a referral.  She has an 8 lb weight loss over the past few months.  States she does have occasional black stools.  Denies any bright red bleeding, denies the use of OTC iron supplements.  Discussed the findings and need for a referral to GI for further evaluation.  Karen Mooney is to schedule patient a follow up appointment with the [facility] and call and inform patient of her appointment date and time.  Patient is aware to expect her call.    Breast and Cervical Cancer Risk Assessment: Patient does not have family history of breast cancer, known genetic  mutations, or radiation treatment to the chest before age 56. Patient does not have history of cervical dysplasia, immunocompromised, or DES exposure in-utero.  Risk Assessment     Risk Scores       05/29/2022   Last edited by: Karen Rutherford, LPN   5-year risk: 1.4 %   Lifetime risk: 6.9 %            A: BCCCP exam without pap smear Complaint of BCCCP screening  P: Referred patient to the [facility] for a screening mammogram. Appointment scheduled today.  Karen Like, RN 05/29/2022 10:52 AM

## 2023-02-27 IMAGING — MG MM DIGITAL SCREENING BILAT W/ TOMO AND CAD
6 of 10 series · 6 of 30 positions shown · non-contrast
Comparison: None available.

CLINICAL DATA: Screening.

EXAM:
DIGITAL SCREENING BILATERAL MAMMOGRAM WITH TOMOSYNTHESIS AND CAD
TECHNIQUE: Bilateral screening digital craniocaudal and mediolateral oblique
mammograms were obtained. Bilateral screening digital breast
tomosynthesis was performed. The images were evaluated with
computer-aided detection.

[R XCCL synth-2D]
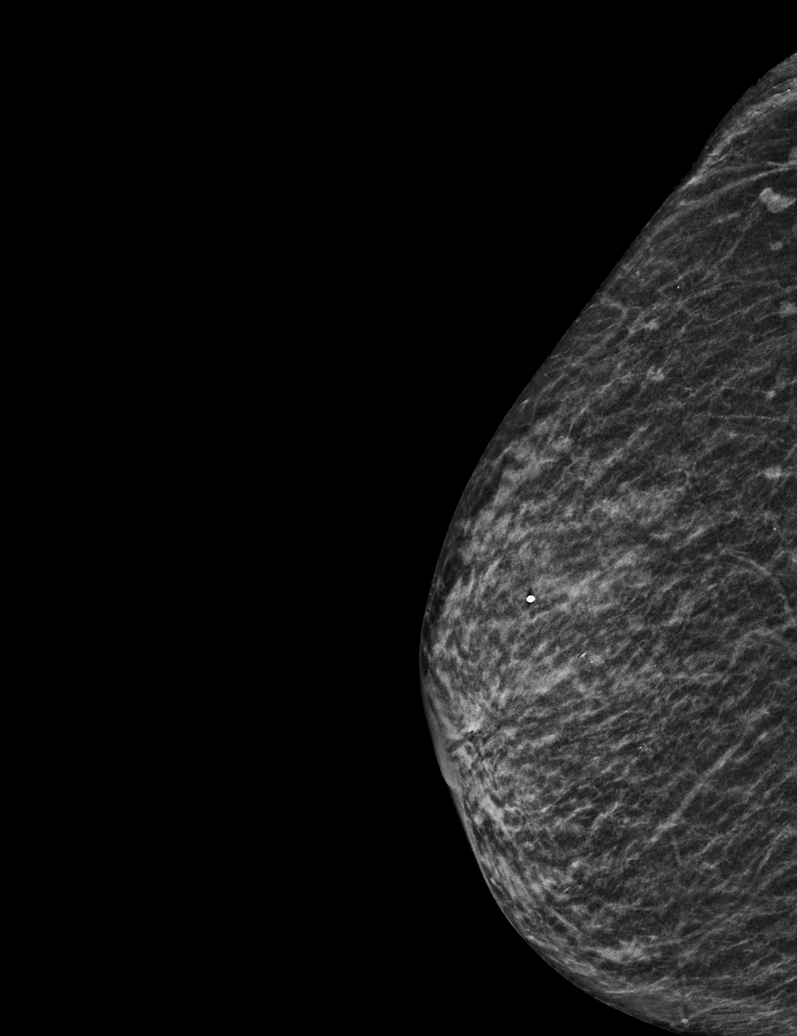

[L CC synth-2D]
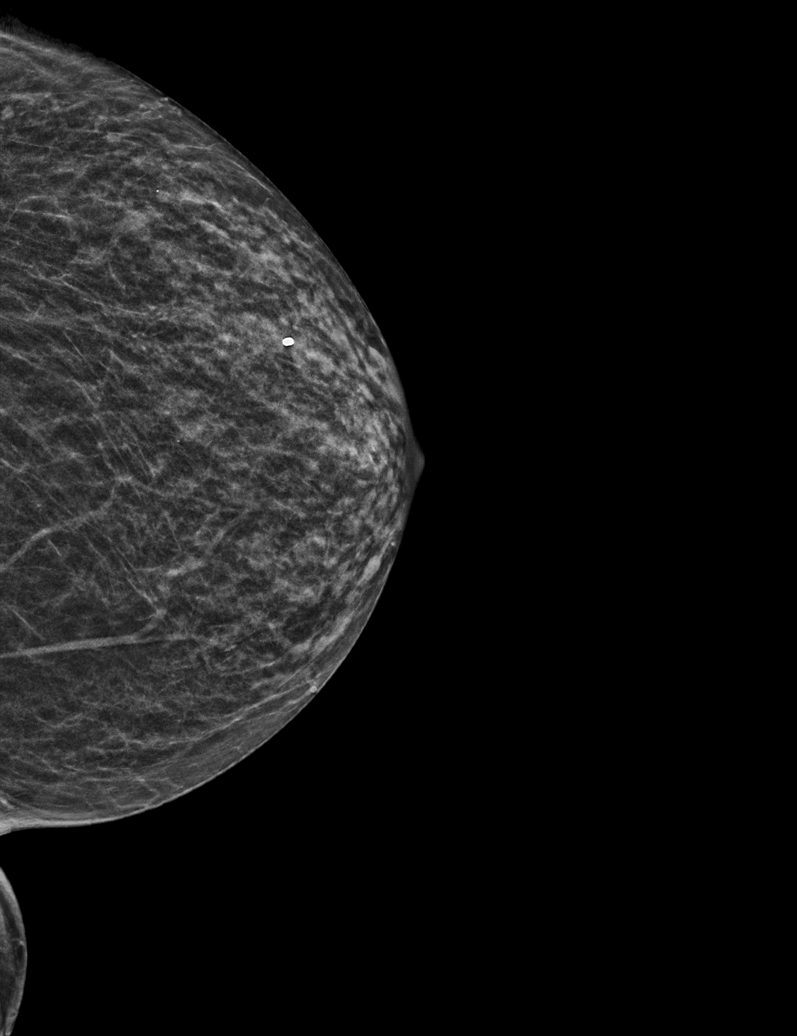

[R MLO synth-2D]
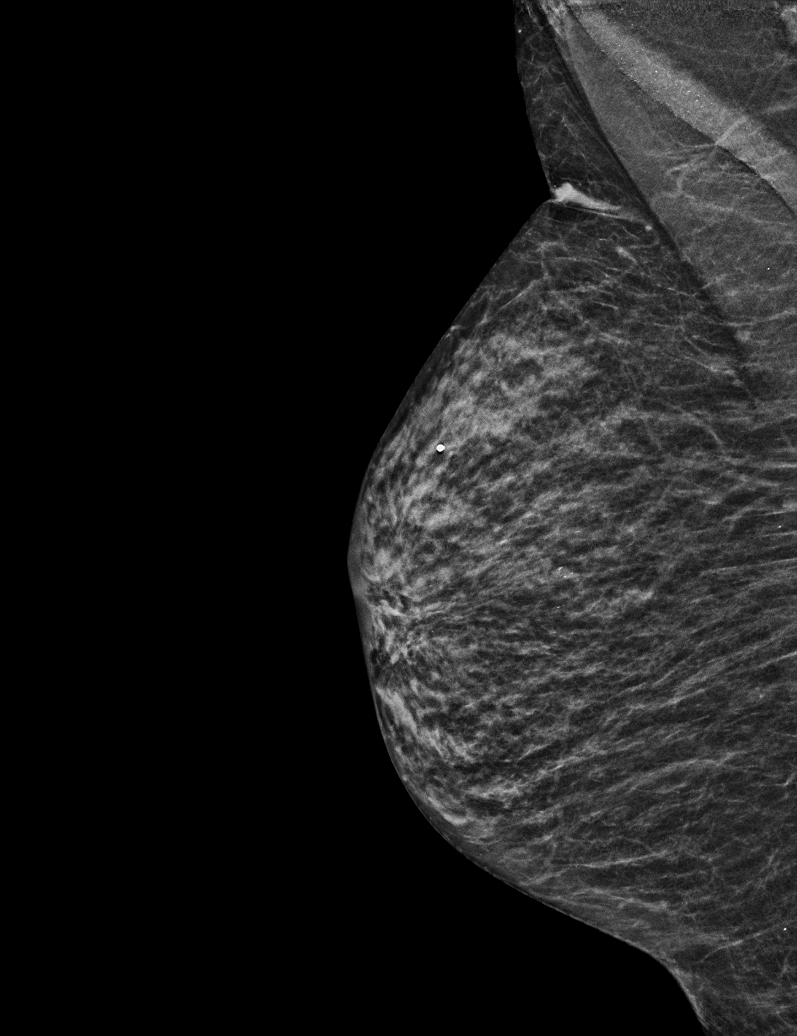

[R CC synth-2D]
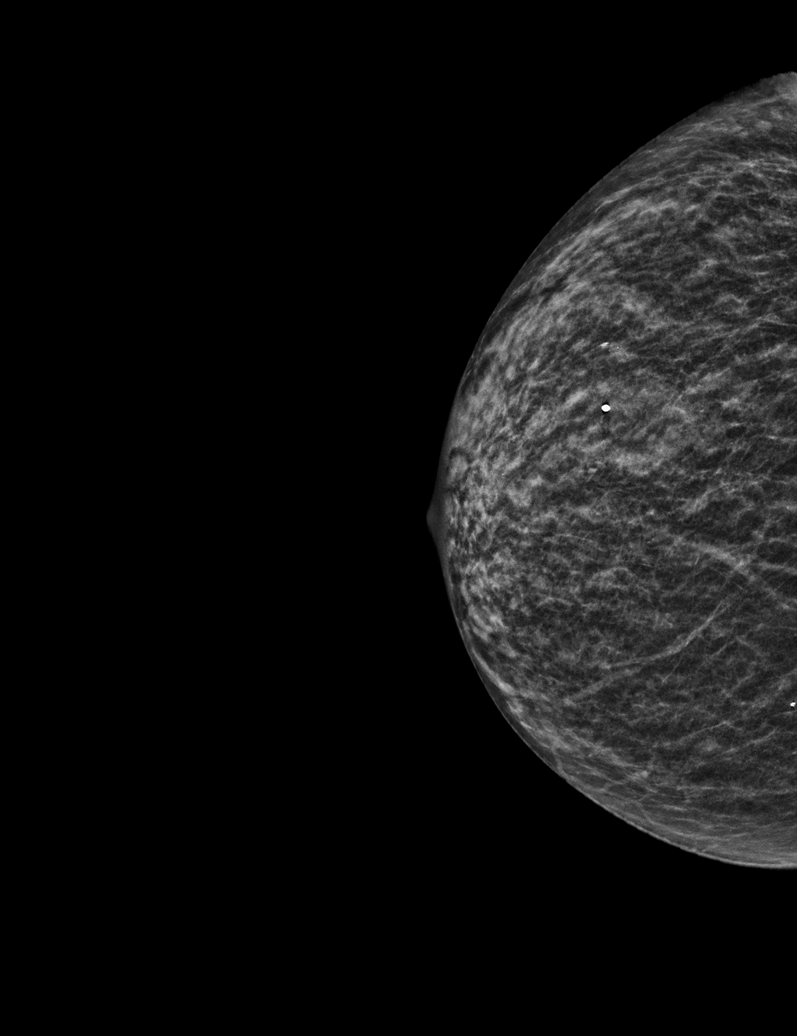

[L MLO synth-2D]
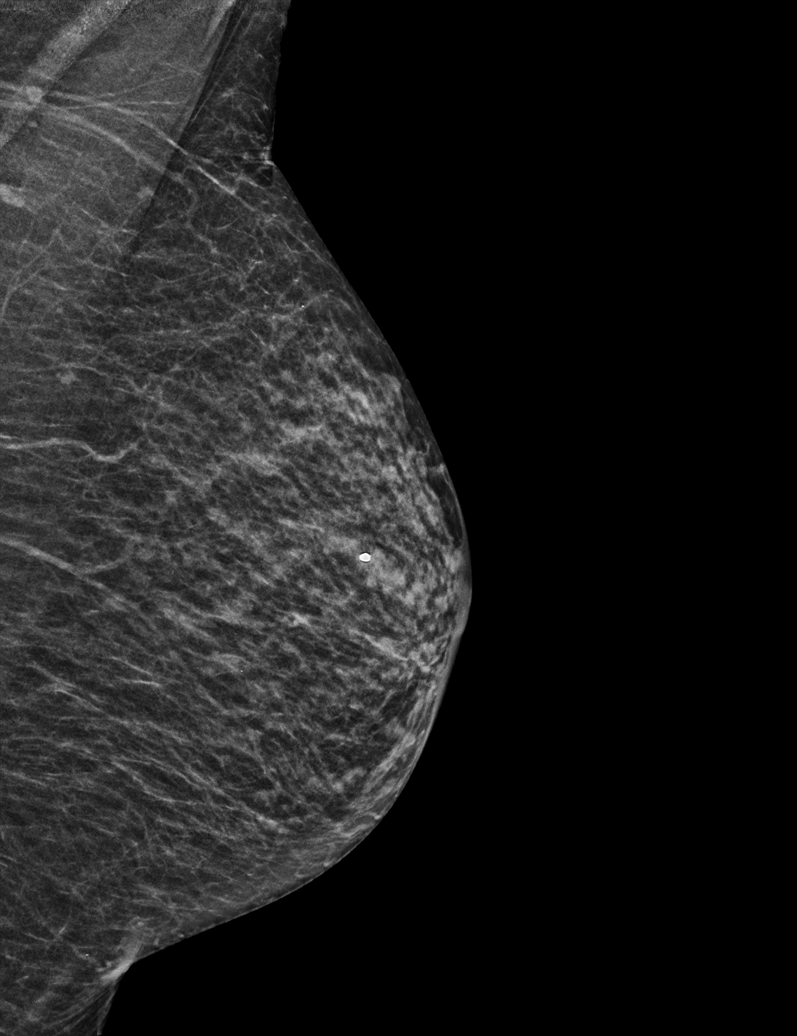

[R CC tomo · tomo slice 17/34.0]
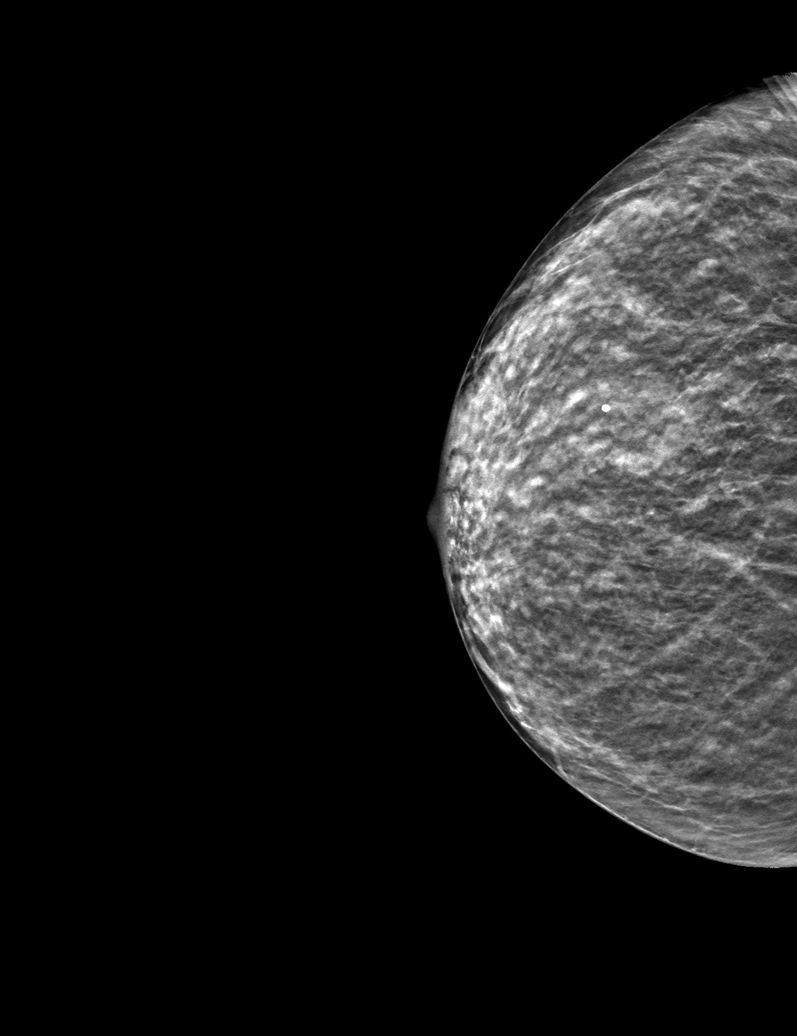

[6 of 30 positions shown; findings below may reference images not displayed]

ACR Breast Density Category b: There are scattered areas of
fibroglandular density.
FINDINGS: There are no findings suspicious for malignancy.
IMPRESSION: No mammographic evidence of malignancy. A result letter of this
screening mammogram will be mailed directly to the patient.

RECOMMENDATION:
Screening mammogram in one year. (Code:[id number])

BI-RADS CATEGORY  1: Negative.

## 2023-07-16 DIAGNOSIS — I10 Essential (primary) hypertension: Secondary | ICD-10-CM | POA: Diagnosis not present

## 2023-07-16 DIAGNOSIS — Z8249 Family history of ischemic heart disease and other diseases of the circulatory system: Secondary | ICD-10-CM | POA: Diagnosis not present

## 2023-07-16 DIAGNOSIS — Z720 Tobacco use: Secondary | ICD-10-CM | POA: Diagnosis not present

## 2024-09-27 ENCOUNTER — Encounter: Payer: Self-pay | Admitting: Internal Medicine

## 2024-09-27 ENCOUNTER — Emergency Department: Payer: MEDICAID

## 2024-09-27 ENCOUNTER — Inpatient Hospital Stay
Admission: EM | Admit: 2024-09-27 | Discharge: 2024-10-02 | DRG: 682 | Disposition: A | Discharge status: Home / Self Care | Payer: MEDICAID | Attending: Hospitalist | Admitting: Hospitalist

## 2024-09-27 ENCOUNTER — Other Ambulatory Visit: Payer: Self-pay

## 2024-09-27 DIAGNOSIS — E871 Hypo-osmolality and hyponatremia: Secondary | ICD-10-CM | POA: Diagnosis present

## 2024-09-27 DIAGNOSIS — F17210 Nicotine dependence, cigarettes, uncomplicated: Secondary | ICD-10-CM | POA: Diagnosis present

## 2024-09-27 DIAGNOSIS — Z87440 Personal history of urinary (tract) infections: Secondary | ICD-10-CM | POA: Diagnosis not present

## 2024-09-27 DIAGNOSIS — N179 Acute kidney failure, unspecified: Secondary | ICD-10-CM | POA: Diagnosis present

## 2024-09-27 DIAGNOSIS — F17200 Nicotine dependence, unspecified, uncomplicated: Secondary | ICD-10-CM | POA: Diagnosis present

## 2024-09-27 DIAGNOSIS — N1831 Chronic kidney disease, stage 3a: Secondary | ICD-10-CM | POA: Diagnosis present

## 2024-09-27 DIAGNOSIS — E876 Hypokalemia: Principal | ICD-10-CM | POA: Diagnosis present

## 2024-09-27 DIAGNOSIS — D539 Nutritional anemia, unspecified: Secondary | ICD-10-CM | POA: Diagnosis present

## 2024-09-27 DIAGNOSIS — G8929 Other chronic pain: Secondary | ICD-10-CM | POA: Diagnosis present

## 2024-09-27 DIAGNOSIS — F1010 Alcohol abuse, uncomplicated: Secondary | ICD-10-CM | POA: Diagnosis present

## 2024-09-27 DIAGNOSIS — K529 Noninfective gastroenteritis and colitis, unspecified: Secondary | ICD-10-CM | POA: Diagnosis present

## 2024-09-27 DIAGNOSIS — Z681 Body mass index (BMI) 19 or less, adult: Secondary | ICD-10-CM

## 2024-09-27 DIAGNOSIS — M25512 Pain in left shoulder: Secondary | ICD-10-CM | POA: Diagnosis present

## 2024-09-27 DIAGNOSIS — M12812 Other specific arthropathies, not elsewhere classified, left shoulder: Secondary | ICD-10-CM | POA: Diagnosis present

## 2024-09-27 DIAGNOSIS — E860 Dehydration: Secondary | ICD-10-CM | POA: Diagnosis present

## 2024-09-27 DIAGNOSIS — E878 Other disorders of electrolyte and fluid balance, not elsewhere classified: Secondary | ICD-10-CM | POA: Diagnosis present

## 2024-09-27 DIAGNOSIS — Z8419 Family history of other disorders of kidney and ureter: Secondary | ICD-10-CM | POA: Diagnosis not present

## 2024-09-27 DIAGNOSIS — E872 Acidosis, unspecified: Secondary | ICD-10-CM | POA: Diagnosis present

## 2024-09-27 DIAGNOSIS — E43 Unspecified severe protein-calorie malnutrition: Secondary | ICD-10-CM | POA: Diagnosis present

## 2024-09-27 DIAGNOSIS — Z8249 Family history of ischemic heart disease and other diseases of the circulatory system: Secondary | ICD-10-CM

## 2024-09-27 DIAGNOSIS — R32 Unspecified urinary incontinence: Secondary | ICD-10-CM | POA: Diagnosis present

## 2024-09-27 DIAGNOSIS — R197 Diarrhea, unspecified: Secondary | ICD-10-CM

## 2024-09-27 DIAGNOSIS — R112 Nausea with vomiting, unspecified: Secondary | ICD-10-CM | POA: Diagnosis present

## 2024-09-27 HISTORY — DX: Nicotine dependence, unspecified, uncomplicated: F17.200

## 2024-09-27 HISTORY — DX: Depression, unspecified: F32.A

## 2024-09-27 HISTORY — DX: Adjustment disorder, unspecified: F43.20

## 2024-09-27 HISTORY — DX: Chronic kidney disease, stage 3a: N18.31

## 2024-09-27 HISTORY — DX: Alcohol abuse, uncomplicated: F10.10

## 2024-09-27 LAB — CBC WITH DIFFERENTIAL/PLATELET
Abs Immature Granulocytes: 0.03 K/uL (ref 0.00–0.07)
Basophils Absolute: 0 K/uL (ref 0.0–0.1)
Basophils Relative: 0 %
Eosinophils Absolute: 0 K/uL (ref 0.0–0.5)
Eosinophils Relative: 0 %
HCT: 27.4 % — ABNORMAL LOW (ref 36.0–46.0)
Hemoglobin: 9.4 g/dL — ABNORMAL LOW (ref 12.0–15.0)
Immature Granulocytes: 0 %
Lymphocytes Relative: 17 %
Lymphs Abs: 1.4 K/uL (ref 0.7–4.0)
MCH: 34.6 pg — ABNORMAL HIGH (ref 26.0–34.0)
MCHC: 34.3 g/dL (ref 30.0–36.0)
MCV: 100.7 fL — ABNORMAL HIGH (ref 80.0–100.0)
Monocytes Absolute: 0.3 K/uL (ref 0.1–1.0)
Monocytes Relative: 4 %
Neutro Abs: 6.3 K/uL (ref 1.7–7.7)
Neutrophils Relative %: 79 %
Platelets: 351 K/uL (ref 150–400)
RBC: 2.72 MIL/uL — ABNORMAL LOW (ref 3.87–5.11)
RDW: 14.6 % (ref 11.5–15.5)
WBC: 8 K/uL (ref 4.0–10.5)
nRBC: 0 % (ref 0.0–0.2)

## 2024-09-27 LAB — LACTIC ACID, PLASMA: Lactic Acid, Venous: 1.4 mmol/L (ref 0.5–1.9)

## 2024-09-27 LAB — COMPREHENSIVE METABOLIC PANEL WITH GFR
ALT: 13 U/L (ref 0–44)
AST: 24 U/L (ref 15–41)
Albumin: 2.9 g/dL — ABNORMAL LOW (ref 3.5–5.0)
Alkaline Phosphatase: 109 U/L (ref 38–126)
Anion gap: 17 — ABNORMAL HIGH (ref 5–15)
BUN: 30 mg/dL — ABNORMAL HIGH (ref 8–23)
CO2: 12 mmol/L — ABNORMAL LOW (ref 22–32)
Calcium: 6.3 mg/dL — CL (ref 8.9–10.3)
Chloride: 102 mmol/L (ref 98–111)
Creatinine, Ser: 2.01 mg/dL — ABNORMAL HIGH (ref 0.44–1.00)
GFR, Estimated: 27 mL/min — ABNORMAL LOW (ref >60)
Glucose, Bld: 105 mg/dL — ABNORMAL HIGH (ref 70–99)
Potassium: <2 mmol/L — CL (ref 3.5–5.1)
Sodium: 131 mmol/L — ABNORMAL LOW (ref 135–145)
Total Bilirubin: 1.4 mg/dL — ABNORMAL HIGH (ref 0.0–1.2)
Total Protein: 7.2 g/dL (ref 6.5–8.1)

## 2024-09-27 LAB — LIPASE, BLOOD: Lipase: 47 U/L (ref 11–51)

## 2024-09-27 LAB — IRON AND TIBC
Iron: 108 ug/dL (ref 28–170)
Saturation Ratios: 59 % — ABNORMAL HIGH (ref 10.4–31.8)
TIBC: 183 ug/dL — ABNORMAL LOW (ref 250–450)
UIBC: 75 ug/dL

## 2024-09-27 LAB — RETICULOCYTES
Immature Retic Fract: 7.2 % (ref 2.3–15.9)
RBC.: 2.68 MIL/uL — ABNORMAL LOW (ref 3.87–5.11)
Retic Count, Absolute: 28.7 K/uL (ref 19.0–186.0)
Retic Ct Pct: 1.1 % (ref 0.4–3.1)

## 2024-09-27 LAB — MAGNESIUM: Magnesium: 1 mg/dL — ABNORMAL LOW (ref 1.7–2.4)

## 2024-09-27 LAB — POTASSIUM: Potassium: 3.2 mmol/L — ABNORMAL LOW (ref 3.5–5.1)

## 2024-09-27 LAB — FERRITIN: Ferritin: 540 ng/mL — ABNORMAL HIGH (ref 11–307)

## 2024-09-27 LAB — FOLATE: Folate: 8.1 ng/mL (ref >5.9)

## 2024-09-27 LAB — PHOSPHORUS: Phosphorus: 2.1 mg/dL — ABNORMAL LOW (ref 2.5–4.6)

## 2024-09-27 MED ORDER — POTASSIUM CHLORIDE CRYS ER 20 MEQ PO TBCR
40.0000 meq | EXTENDED_RELEASE_TABLET | ORAL | Status: AC
  Administered 2024-09-27 – 2024-09-28 (×2): 40 meq via ORAL
  Filled 2024-09-27 (×2): qty 2

## 2024-09-27 MED ORDER — MAGNESIUM SULFATE 2 GM/50ML IV SOLN
2.0000 g | Freq: Once | INTRAVENOUS | Status: AC
  Administered 2024-09-27: 2 g via INTRAVENOUS
  Filled 2024-09-27: qty 50

## 2024-09-27 MED ORDER — SODIUM CHLORIDE 0.9 % IV SOLN: INTRAVENOUS | Status: DC

## 2024-09-27 MED ORDER — THIAMINE HCL 100 MG/ML IJ SOLN
100.0000 mg | Freq: Every day | INTRAMUSCULAR | Status: DC
  Filled 2024-09-27 (×4): qty 2

## 2024-09-27 MED ORDER — ENSURE PLUS HIGH PROTEIN PO LIQD: 237.0000 mL | Freq: Two times a day (BID) | ORAL | Status: DC

## 2024-09-27 MED ORDER — THIAMINE MONONITRATE 100 MG PO TABS
100.0000 mg | ORAL_TABLET | Freq: Every day | ORAL | Status: DC
  Administered 2024-09-27 – 2024-10-02 (×6): 100 mg via ORAL
  Filled 2024-09-27 (×6): qty 1

## 2024-09-27 MED ORDER — MAGNESIUM SULFATE 2 GM/50ML IV SOLN
2.0000 g | Freq: Once | INTRAVENOUS | Status: DC
  Filled 2024-09-27: qty 50

## 2024-09-27 MED ORDER — LORAZEPAM 2 MG/ML IJ SOLN: 1.0000 mg | INTRAMUSCULAR | Status: AC | PRN

## 2024-09-27 MED ORDER — LIDOCAINE 5 % EX PTCH
1.0000 | MEDICATED_PATCH | CUTANEOUS | Status: DC
  Administered 2024-09-27 – 2024-10-01 (×5): 1 via TRANSDERMAL
  Filled 2024-09-27 (×6): qty 1

## 2024-09-27 MED ORDER — OXYCODONE HCL 5 MG PO TABS
5.0000 mg | ORAL_TABLET | Freq: Four times a day (QID) | ORAL | Status: DC | PRN
  Administered 2024-09-29: 5 mg via ORAL
  Filled 2024-09-27: qty 1

## 2024-09-27 MED ORDER — MAGNESIUM SULFATE 4 GM/100ML IV SOLN
4.0000 g | Freq: Once | INTRAVENOUS | Status: AC
  Administered 2024-09-28: 4 g via INTRAVENOUS
  Filled 2024-09-27 (×2): qty 100

## 2024-09-27 MED ORDER — SODIUM CHLORIDE 0.9 % IV BOLUS
1000.0000 mL | Freq: Once | INTRAVENOUS | Status: AC
  Administered 2024-09-27: 1000 mL via INTRAVENOUS

## 2024-09-27 MED ORDER — ACETAMINOPHEN 325 MG PO TABS: 650.0000 mg | ORAL_TABLET | Freq: Four times a day (QID) | ORAL | Status: DC | PRN

## 2024-09-27 MED ORDER — LORAZEPAM 2 MG/ML IJ SOLN
0.0000 mg | Freq: Four times a day (QID) | INTRAMUSCULAR | Status: DC
  Administered 2024-09-27 – 2024-09-28 (×2): 2 mg via INTRAVENOUS
  Filled 2024-09-27 (×2): qty 1

## 2024-09-27 MED ORDER — POTASSIUM CHLORIDE 20 MEQ PO PACK: 40.0000 meq | PACK | Freq: Two times a day (BID) | ORAL | Status: DC

## 2024-09-27 MED ORDER — POTASSIUM CHLORIDE 10 MEQ/100ML IV SOLN
10.0000 meq | Freq: Once | INTRAVENOUS | Status: AC
  Administered 2024-09-27: 10 meq via INTRAVENOUS
  Filled 2024-09-27: qty 100

## 2024-09-27 MED ORDER — NICOTINE 21 MG/24HR TD PT24
21.0000 mg | MEDICATED_PATCH | Freq: Every day | TRANSDERMAL | Status: DC
  Administered 2024-09-27 – 2024-10-02 (×6): 21 mg via TRANSDERMAL
  Filled 2024-09-27 (×6): qty 1

## 2024-09-27 MED ORDER — LORAZEPAM 1 MG PO TABS
1.0000 mg | ORAL_TABLET | ORAL | Status: AC | PRN
  Administered 2024-09-29: 1 mg via ORAL
  Filled 2024-09-27: qty 1

## 2024-09-27 MED ORDER — ADULT MULTIVITAMIN W/MINERALS CH
1.0000 | ORAL_TABLET | Freq: Every day | ORAL | Status: DC
  Administered 2024-09-27 – 2024-10-02 (×6): 1 via ORAL
  Filled 2024-09-27 (×6): qty 1

## 2024-09-27 MED ORDER — POTASSIUM CHLORIDE 10 MEQ/100ML IV SOLN
10.0000 meq | Freq: Once | INTRAVENOUS | Status: DC
  Filled 2024-09-27: qty 100

## 2024-09-27 MED ORDER — CALCIUM GLUCONATE-NACL 1-0.675 GM/50ML-% IV SOLN
1.0000 g | Freq: Once | INTRAVENOUS | Status: AC
  Administered 2024-09-27: 1000 mg via INTRAVENOUS
  Filled 2024-09-27: qty 50

## 2024-09-27 MED ORDER — ACETAMINOPHEN 500 MG PO TABS
1000.0000 mg | ORAL_TABLET | Freq: Once | ORAL | Status: AC
  Administered 2024-09-27: 1000 mg via ORAL
  Filled 2024-09-27: qty 2

## 2024-09-27 MED ORDER — FOLIC ACID 1 MG PO TABS
1.0000 mg | ORAL_TABLET | Freq: Every day | ORAL | Status: DC
  Administered 2024-09-27 – 2024-10-02 (×6): 1 mg via ORAL
  Filled 2024-09-27 (×6): qty 1

## 2024-09-27 MED ORDER — OXYCODONE-ACETAMINOPHEN 5-325 MG PO TABS: 1.0000 | ORAL_TABLET | ORAL | Status: DC | PRN

## 2024-09-27 MED ORDER — ENOXAPARIN SODIUM 30 MG/0.3ML IJ SOSY
30.0000 mg | PREFILLED_SYRINGE | INTRAMUSCULAR | Status: DC
  Administered 2024-09-27 – 2024-10-01 (×5): 30 mg via SUBCUTANEOUS
  Filled 2024-09-27 (×5): qty 0.3

## 2024-09-27 MED ORDER — LORAZEPAM 2 MG/ML IJ SOLN: 0.0000 mg | Freq: Two times a day (BID) | INTRAMUSCULAR | Status: DC

## 2024-09-27 MED ORDER — ONDANSETRON HCL 4 MG/2ML IJ SOLN: 4.0000 mg | Freq: Three times a day (TID) | INTRAMUSCULAR | Status: DC | PRN

## 2024-09-27 NOTE — Progress Notes (Signed)
 PHARMACY CONSULT NOTE - ELECTROLYTES  Pharmacy Consult for Electrolyte Monitoring and Replacement   Recent Labs: Height: 5' 5 (165.1 cm) Weight: 39.7 kg (87 lb 8.4 oz) IBW/kg (Calculated) : 57 Estimated Creatinine Clearance: 18 mL/min (A) (by C-G formula based on SCr of 2.01 mg/dL (H)). Potassium (mmol/L)  Date Value  09/27/2024 <2.0 (LL)   Magnesium  (mg/dL)  Date Value  89/94/7974 1.0 (L)   Calcium (mg/dL)  Date Value  89/94/7974 6.3 (LL)   Albumin (g/dL)  Date Value  89/94/7974 2.9 (L)   Phosphorus (mg/dL)  Date Value  90/90/7977 3.1   Sodium (mmol/L)  Date Value  09/27/2024 131 (L)   Corrected Ca: 7.2 mg/dL  Assessment  Karen Mooney is a 63 y.o. female presenting with diarrhea and vomiting. PMH significant for hyponatremia, recurrent UTIs, tobacco use. Pharmacy has been consulted to monitor and replace electrolytes.  Diet: Regular MIVF: NS @ 100 mL/hr Pertinent medications: N/A  Goal of Therapy: Electrolytes WNL  Plan:  Mag 1.0: MD has ordered magnesium  sulfate 4 g IV x1, will order an additional 2 g IV K <2.0: MD has ordered KCL 10 mEq IV x2 + KCL 40 mEq PO x3 Corrected calcium 7.2: MD ordered calcium gluconate 1 g IV x1 F/u potassium level 2-4 hours after last IV dose given Check BMP, Mg, Phos with AM labs  Thank you for allowing pharmacy to be a part of this patient's care.  Damien Napoleon, PharmD Clinical Pharmacist 09/27/2024 7:20 PM

## 2024-09-27 NOTE — H&P (Signed)
 History and Physical    Karen Mooney FMW:[id number] DOB: 1961-09-30 DOA: 09/27/2024  Referring MD/NP/PA:   PCP: [facility]   Patient coming from:  The patient is coming from home.     Chief Complaint: diarrhea  HPI: Karen Mooney is a 63 y.o. female with medical history significant of alcohol and tobacco abuse, CKD-3a, depression, adjustment disorder, anemia, recurrent UTI, who presents with diarrhea.  Patient states that she has diarrhea for more than 1 month, with more than 10 times of loose stool bowel movement each day.  Since yesterday she has had 2 episode of nonbilious nonbloody vomiting, with nausea.  Denies abdominal pain.  No chest pain, cough, SOB.  She reports mild burning sensation on urination, no dysuria or hematuria.  No fever or chills.  She reports chronic left shoulder pain.  Patient has dizziness and lightheadedness.  No unilateral numbness or tingling extremities.  No facial droop or slurred speech.  Patient reports 15-20 pounds weight loss over the past 1 or 2 months.  She has poor appetite and decreased oral intake.  Data reviewed independently and ED Course: pt was found to have electrolyte disturbance (potassium <2.0, magnesium  1.1, sodium 131, calcium 6.3, pending phosphorus), bicarbonate of 12, worsening renal function. Pt is admitted to telemetry bed as inpatient.   X-ray of left shoulder:  Minor acromioclavicular spurring. Slight superior subluxation of the humeral head may represent rotator cuff arthropathy.    EKG: I have personally reviewed.  Sinus rhythm, QTc 477, early R wave pression.   Review of Systems:   General: no fevers, chills, no body weight gain, has poor appetite, has fatigue HEENT: no blurry vision, hearing changes or sore throat Respiratory: no dyspnea, coughing, wheezing CV: no chest pain, no palpitations GI: has nausea, vomiting, diarrhea, no AP or constipation GU: no dysuria, burning on urination,  increased urinary frequency, hematuria  Ext: no leg edema Neuro: no unilateral weakness, numbness, or tingling, no vision change or hearing loss. Has left shoulder pain. Skin: no rash, no skin tear. MSK: No muscle spasm, no deformity, no limitation of range of movement in spin Heme: No easy bruising.  Travel history: No recent long distant travel.   Allergy: No Known Allergies  Past Medical History:  Diagnosis Date   Adjustment disorder    Alcohol abuse    Chronic renal failure (CRF), stage 3a (HCC)    Depression    Tobacco use disorder     Past Surgical History:  Procedure Laterality Date   ECTOPIC PREGNANCY SURGERY      Social History:  reports that she has been smoking cigarettes. She has never used smokeless tobacco. She reports current alcohol use. She reports that she does not use drugs.  Family History:  Family History  Problem Relation Age of Onset   Kidney disease Mother    Heart disease Father    CAD Neg Hx    Diabetes Neg Hx      Prior to Admission medications   Not on File    Physical Exam: Vitals:   09/27/24 1649 09/27/24 1918 09/27/24 2003 09/28/24 0000  BP:  (!) 144/85 (!) 145/100 131/64  Pulse:  80 83 77  Resp:  18    Temp:  97.6 F (36.4 C) (!) 97.2 F (36.2 C) (!) 97.4 F (36.3 C)  TempSrc:   Axillary Axillary  SpO2:  100% 100%   Weight: 39.7 kg  43.1 kg   Height:   5' 6 (1.676  m)    General: Not in acute distress.  Dry mucous membrane.  Thin body habitus HEENT:       Eyes: PERRL, EOMI, no jaundice       ENT: No discharge from the ears and nose, no pharynx injection, no tonsillar enlargement.        Neck: No JVD, no bruit, no mass felt. Heme: No neck lymph node enlargement. Cardiac: S1/S2, RRR, No murmurs, No gallops or rubs. Respiratory: No rales, wheezing, rhonchi or rubs. GI: Soft, nondistended, nontender, no rebound pain, no organomegaly, BS present. GU: No hematuria Ext: No pitting leg edema bilaterally. 1+DP/PT pulse  bilaterally. Musculoskeletal: No joint deformities, No joint redness or warmth.  Has left shoulder tenderness. Skin: No rashes.  Neuro: Alert, oriented X3, cranial nerves II-XII grossly intact, moves all extremities normally.  Psych: Patient is not psychotic, no suicidal or hemocidal ideation.  Labs on Admission: I have personally reviewed following labs and imaging studies  CBC: Recent Labs  Lab 09/27/24 1717  WBC 8.0  NEUTROABS 6.3  HGB 9.4*  HCT 27.4*  MCV 100.7*  PLT 351   Basic Metabolic Panel: Recent Labs  Lab 09/27/24 1717 09/27/24 2132  NA 131*  --   K <2.0* 3.2*  CL 102  --   CO2 12*  --   GLUCOSE 105*  --   BUN 30*  --   CREATININE 2.01*  --   CALCIUM 6.3*  --   MG 1.0*  --   PHOS 2.1*  --    GFR: Estimated Creatinine Clearance: 19.5 mL/min (A) (by C-G formula based on SCr of 2.01 mg/dL (H)). Liver Function Tests: Recent Labs  Lab 09/27/24 1717  AST 24  ALT 13  ALKPHOS 109  BILITOT 1.4*  PROT 7.2  ALBUMIN 2.9*   Recent Labs  Lab 09/27/24 1717  LIPASE 47   No results for input(s): AMMONIA in the last 168 hours. Coagulation Profile: No results for input(s): INR, PROTIME in the last 168 hours. Cardiac Enzymes: No results for input(s): CKTOTAL, CKMB, CKMBINDEX, TROPONINI in the last 168 hours. BNP (last 3 results) No results for input(s): PROBNP in the last 8760 hours. HbA1C: No results for input(s): HGBA1C in the last 72 hours. CBG: No results for input(s): GLUCAP in the last 168 hours. Lipid Profile: No results for input(s): CHOL, HDL, LDLCALC, TRIG, CHOLHDL, LDLDIRECT in the last 72 hours. Thyroid Function Tests: No results for input(s): TSH, T4TOTAL, FREET4, T3FREE, THYROIDAB in the last 72 hours. Anemia Panel: Recent Labs    09/27/24 2132 09/27/24 2133  FOLATE 8.1  --   FERRITIN 540*  --   TIBC 183*  --   IRON 108  --   RETICCTPCT  --  1.1   Urine analysis:    Component Value  Date/Time   COLORURINE YELLOW 08/27/2021 0006   APPEARANCEUR CLOUDY (A) 08/27/2021 0006   APPEARANCEUR Hazy 09/22/2012 1315   LABSPEC 1.020 08/27/2021 0006   LABSPEC 1.002 09/22/2012 1315   PHURINE 6.0 08/27/2021 0006   GLUCOSEU NEGATIVE 08/27/2021 0006   GLUCOSEU Negative 09/22/2012 1315   HGBUR LARGE (A) 08/27/2021 0006   BILIRUBINUR NEGATIVE 08/27/2021 0006   BILIRUBINUR Negative 09/22/2012 1315   KETONESUR TRACE (A) 08/27/2021 0006   PROTEINUR >300 (A) 08/27/2021 0006   NITRITE NEGATIVE 08/27/2021 0006   LEUKOCYTESUR LARGE (A) 08/27/2021 0006   LEUKOCYTESUR 2+ 09/22/2012 1315   Sepsis Labs: @LABRCNTIP (procalcitonin:4,lacticidven:4) )No results found for this or any previous visit (from the past 240  hours).   Radiological Exams on Admission:   Assessment/Plan Principal Problem:   Electrolyte disturbance Active Problems:   Hyponatremia   Hypokalemia   Hypomagnesemia   Hypocalcemia   Hypophosphatemia   Nausea vomiting and diarrhea   Dehydration   Acute renal failure superimposed on stage 3a chronic kidney disease (HCC)   Metabolic acidosis   Macrocytic anemia   Left shoulder pain   Tobacco use disorder   Alcohol abuse   Protein-calorie malnutrition, severe   Assessment and Plan:   Electrolyte disturbance including hyponatremia, hypokalemia, hypomagnesemia, hypocalcemia and hypophosphatemia: Potassium <2.0, magnesium  1.1, sodium 131, calcium 6.3, phosphorus 2.1  - Admitted to telemetry bed as inpatient - Consult pharmacist for monitoring electrolytes - Repleted potassium, magnesium , calcium and phosphorus - IV normal saline: 1 L, then 100 cc/h  Nausea vomiting and diarrhea:  -Check C. difficile and GI pathogen panel - As needed Zofran - IV fluid  Dehydration -IV fluid as above  Acute renal failure superimposed on stage 3a chronic kidney disease (HCC): Baseline creatinine 1.19 on 09/01/2021.  Her creatinine is at 2.01, BUN 30, GFR 27 today.  Likely due to  dehydration -IV fluid as above  Metabolic acidosis: Bicarbonate is 12.  Likely due to alcohol abuse -IV fluids above - Check lactic acid level  Macrocytic anemia: Hemoglobin 8.8 on 09/01/2024.  Her creatinine is 9.4 today. -Follow-up with CBC  Left shoulder pain: X-ray showed minor acromioclavicular spurring, and slight superior subluxation of the humeral head may represent rotator cuff arthropathy. - Lidoderm  patch - As needed oxycodone and Tylenol  for pain - May need to follow-up with Ortho  Tobacco use disorder and alcohol abuse: - Due to counseling about importance of quitting smoking and alcohol use - Nicotine  patch - CIWA protocol  Protein-calorie malnutrition, severe: Patient body weight 43.1 kg, BMI 15.33 - Ensure - Nutrition consult      DVT ppx:  SQ Lovenox  Code Status: Full code   Family Communication: Yes, patient's 2 brothers at bed side.     Disposition Plan:  Anticipate discharge back to previous environment  Consults called:  none  Admission status and Level of care: Telemetry Medical: as inpt        Dispo: The patient is from: Home              Anticipated d/c is to: Home              Anticipated d/c date is: 2 days              Patient currently is not medically stable to d/c.    Severity of Illness:  The appropriate patient status for this patient is INPATIENT. Inpatient status is judged to be reasonable and necessary in order to provide the required intensity of service to ensure the patient's safety. The patient's presenting symptoms, physical exam findings, and initial radiographic and laboratory data in the context of their chronic comorbidities is felt to place them at high risk for further clinical deterioration. Furthermore, it is not anticipated that the patient will be medically stable for discharge from the hospital within 2 midnights of admission.   * I certify that at the point of admission it is my clinical judgment that the patient  will require inpatient hospital care spanning beyond 2 midnights from the point of admission due to high intensity of service, high risk for further deterioration and high frequency of surveillance required.*       Date of Service 09/28/2024  Magaby Rumberger [facility]   If 7PM-7AM, please contact night-coverage [email or web address] 09/28/2024, 1:03 AM

## 2024-09-27 NOTE — Progress Notes (Signed)
 PHARMACIST - PHYSICIAN COMMUNICATION  CONCERNING:  Enoxaparin (Lovenox) for DVT Prophylaxis    RECOMMENDATION: Patient was prescribed enoxaprin 40mg  q24 hours for VTE prophylaxis.   Filed Weights   09/27/24 1649  Weight: 39.7 kg (87 lb 8.4 oz)    Body mass index is 14.56 kg/m.  Estimated Creatinine Clearance: 18 mL/min (A) (by C-G formula based on SCr of 2.01 mg/dL (H)).  Patient is candidate for enoxaparin 30mg  every 24 hours based on CrCl <39ml/min or Weight <45kg  DESCRIPTION: Pharmacy has adjusted enoxaparin dose per [facility] policy.  Patient is now receiving enoxaparin 30 mg every 24 hours    Damien Napoleon, PharmD Clinical Pharmacist  09/27/2024 7:19 PM

## 2024-09-27 NOTE — ED Provider Notes (Signed)
 [facility] Provider Note    Event Date/Time   First MD Initiated Contact with Patient 09/27/24 1733     (approximate)   History   Dizziness, Diarrhea, and Vomiting  Pt c/o diarrhea x1 month, vomiting since yesterday, and dizziness x2 weeks. Brother reports pt has lost 15-20 pounds over a month unintentionally. Brother reports recent death of their sister and is concerned she may be depressed/grieving. Pt reports numerous episodes of diarrhea daily. She wears an incontinence brief because she can't always control it.   HPI Karen Mooney is a 63 y.o. female PMH hyponatremia, recurrent UTIs, tobacco use presents for evaluation of vomiting, dizziness, diarrhea - Patient has been having diarrhea for about 1 month.  Notes greater than 10 episodes daily.  [name].  No black or bloody stools.  Had been on antibiotics last month.  Did have 2 episodes of vomiting over the past couple days. - No fever.  No abdominal pain.  No chest pain.  Has felt somewhat lightheadeded. - No chest pain or shortness of breath - No clear urinary symptoms - Has been having to wear a diaper due to diarrhea - Family notes that patient sibling died recently and she has a decreased p.o. intake, apparently has lost 15 to 20 pounds over the past month - Separately complains of pain with left shoulder while ranging over the past month or so.  No preceding trauma.  Per chart review, last admitted 08/26/2021-09/02/2019     Physical Exam   Triage Vital Signs: ED Triage Vitals  Encounter Vitals Group     BP 09/27/24 1648 136/87     Girls Systolic BP Percentile --      Girls Diastolic BP Percentile --      Boys Systolic BP Percentile --      Boys Diastolic BP Percentile --      Pulse Rate 09/27/24 1648 97     Resp 09/27/24 1648 18     Temp 09/27/24 1648 97.8 F (36.6 C)     Temp Source 09/27/24 1648 Oral     SpO2 09/27/24 1648 98 %     Weight 09/27/24 1649 87 lb 8.4 oz (39.7 kg)      Height 09/27/24 1647 5' 5 (1.651 m)     Head Circumference --      Peak Flow --      Pain Score 09/27/24 1647 6     Pain Loc --      Pain Education --      Exclude from Growth Chart --     Most recent vital signs: Vitals:   09/27/24 1648  BP: 136/87  Pulse: 97  Resp: 18  Temp: 97.8 F (36.6 C)  SpO2: 98%     General: Awake, no distress.  CV:  Good peripheral perfusion. RRR, RP 2+ Resp:  Normal effort. CTAB Abd:  No distention. Nontender to deep palpation throughout Neuro:  AO x 4, face symmetric, no focal motor deficit appreciated LLE:   Some pain with ranging of shoulder though range intact.  Tender to palpation over scapula.  No tenderness elsewhere in arm.  Full range of motion of all joints.  RP 2+.  SI LT.   ED Results / Procedures / Treatments   Labs (all labs ordered are listed, but only abnormal results are displayed) Labs Reviewed  MAGNESIUM  - Abnormal; Notable for the following components:      Result Value   Magnesium  1.0 (*)  All other components within normal limits  CBC WITH DIFFERENTIAL/PLATELET - Abnormal; Notable for the following components:   RBC 2.72 (*)    Hemoglobin 9.4 (*)    HCT 27.4 (*)    MCV 100.7 (*)    MCH 34.6 (*)    All other components within normal limits  COMPREHENSIVE METABOLIC PANEL WITH GFR - Abnormal; Notable for the following components:   Sodium 131 (*)    Potassium <2.0 (*)    CO2 12 (*)    Glucose, Bld 105 (*)    BUN 30 (*)    Creatinine, Ser 2.01 (*)    Calcium 6.3 (*)    Albumin 2.9 (*)    Total Bilirubin 1.4 (*)    GFR, Estimated 27 (*)    Anion gap 17 (*)    All other components within normal limits  GASTROINTESTINAL PANEL BY PCR, STOOL (REPLACES STOOL CULTURE)  C DIFFICILE QUICK SCREEN W PCR REFLEX    RESP PANEL BY RT-PCR (RSV, FLU A&B, COVID)  [id number]  LIPASE, BLOOD  URINALYSIS, COMPLETE (UACMP) WITH MICROSCOPIC     EKG  See ED course below.    RADIOLOGY N/a    PROCEDURES:  Critical Care  performed: Yes, see critical care procedure note(s)  .Critical Care  Performed by: Clarine Ozell LABOR, MD Authorized by: Clarine Ozell LABOR, MD   Critical care provider statement:    Critical care time (minutes):  30   Critical care was necessary to treat or prevent imminent or life-threatening deterioration of the following conditions:  Metabolic crisis and dehydration   Critical care was time spent personally by me on the following activities:  Development of treatment plan with patient or surrogate, discussions with consultants, evaluation of patient's response to treatment, examination of patient, ordering and review of laboratory studies, ordering and review of radiographic studies, ordering and performing treatments and interventions, pulse oximetry, re-evaluation of patient's condition and review of old charts    MEDICATIONS ORDERED IN ED: Medications  potassium chloride  (KLOR-CON ) packet 40 mEq (has no administration in time range)  potassium chloride  10 mEq in 100 mL IVPB (has no administration in time range)  potassium chloride  10 mEq in 100 mL IVPB (10 mEq Intravenous New Bag/Given 09/27/24 1855)  calcium gluconate 1 g/ 50 mL sodium chloride  IVPB (1,000 mg Intravenous New Bag/Given 09/27/24 1846)  lidocaine  (LIDODERM ) 5 % 1 patch (1 patch Transdermal Patch Applied 09/27/24 1859)  0.9 %  sodium chloride  infusion (has no administration in time range)  sodium chloride  0.9 % bolus 1,000 mL (1,000 mLs Intravenous New Bag/Given 09/27/24 1856)  acetaminophen  (TYLENOL ) tablet 1,000 mg (1,000 mg Oral Given 09/27/24 1847)     IMPRESSION / MDM / ASSESSMENT AND PLAN / ED COURSE  I reviewed the triage vital signs and the nursing notes.                              DDX/MDM/AP: Differential diagnosis includes, but is not limited to, colitis and consider C. difficile, consider concomitant UTI, do not suspect acute intra-abdominal pathology at this time given very benign exam with no significant  abdominal pain and only diarrhea with trace intermittent vomiting.  Consider electrolyte derangements and dehydration.  No evidence of stroke on my eval.  Suspect muscular strain of back, possible rotator cuff injury, doubt underlying fracture or bony lesion.  Plan: - Labs  -IV fluid - X-ray left shoulder  Patient's presentation is most  consistent with acute presentation with potential threat to life or bodily function.  The patient is on the cardiac monitor to evaluate for evidence of arrhythmia and/or significant heart rate changes.  ED course below.  Workup notable for profound hypokalemia with a potassium less than 2 as well as significant hypocalcemia with a calcium of 6.3.  No obvious EKG changes.  Kept on cardiac monitor.  Replating potassium p.o. and IV as well as IV magnesium  and IV calcium.  AKI on CKD, getting IV fluid.  Will attempt to collect stool styles and urinalysis in ED.  Plan for admission.  Clinical Course as of 09/27/24 1909  [place] Sep 27, 2024  1800 CBC with no leukocytosis, stable anemia  CMP with multiple notable derangements including potassium less than 2 and a calcium of 6.3.  Notable AKI with creatinine of 2, baseline about 1.  Mild hyponatremia. [MM]  1813 Ecg = sinus rhythm, rate 87, no gross ST elevation or depression, no significant repolarization abnormality beyond some T wave inversions in lead III, aVF, V3, normal axis, normal intervals.  No clear evidence of ischemia nor arrhythmia my interpretation. [MM]  1827 X-ray left shoulder reviewed, no acute pathology my interpretation [MM]  1843 Hospitalist consult order placed [MM]  1900 XR L shoulder: IMPRESSION: Minor acromioclavicular spurring. Slight superior subluxation of the humeral head may represent rotator cuff arthropathy.   [MM]    Clinical Course User Index [MM] Clarine Ozell LABOR, MD     FINAL CLINICAL IMPRESSION(S) / ED DIAGNOSES   Final diagnoses:  Hypokalemia  Diarrhea, unspecified type   AKI (acute kidney injury)  Dehydration  Hypocalcemia     Rx / DC Orders   ED Discharge Orders     None        Note:  This document was prepared using Dragon voice recognition software and may include unintentional dictation errors.   Clarine Ozell LABOR, MD 09/27/24 [phone]

## 2024-09-27 NOTE — ED Triage Notes (Addendum)
 Pt c/o diarrhea x1 month, vomiting since yesterday, and dizziness x2 weeks. Brother reports pt has lost 15-20 pounds over a month unintentionally. Brother reports recent death of their sister and is concerned she may be depressed/grieving. Pt reports numerous episodes of diarrhea daily. She wears an incontinence brief because she can't always control it.

## 2024-09-28 LAB — RESP PANEL BY RT-PCR (RSV, FLU A&B, COVID)  RVPGX2
Influenza A by PCR: NEGATIVE
Influenza B by PCR: NEGATIVE
Resp Syncytial Virus by PCR: NEGATIVE
SARS Coronavirus 2 by RT PCR: NEGATIVE

## 2024-09-28 LAB — BASIC METABOLIC PANEL WITH GFR
Anion gap: 14 (ref 5–15)
Anion gap: 13 (ref 5–15)
BUN: 31 mg/dL — ABNORMAL HIGH (ref 8–23)
BUN: 32 mg/dL — ABNORMAL HIGH (ref 8–23)
CO2: 12 mmol/L — ABNORMAL LOW (ref 22–32)
CO2: 13 mmol/L — ABNORMAL LOW (ref 22–32)
Calcium: 6.7 mg/dL — ABNORMAL LOW (ref 8.9–10.3)
Calcium: 7.1 mg/dL — ABNORMAL LOW (ref 8.9–10.3)
Chloride: 106 mmol/L (ref 98–111)
Chloride: 109 mmol/L (ref 98–111)
Creatinine, Ser: 1.76 mg/dL — ABNORMAL HIGH (ref 0.44–1.00)
Creatinine, Ser: 2.08 mg/dL — ABNORMAL HIGH (ref 0.44–1.00)
GFR, Estimated: 32 mL/min — ABNORMAL LOW (ref >60)
GFR, Estimated: 26 mL/min — ABNORMAL LOW (ref >60)
Glucose, Bld: 103 mg/dL — ABNORMAL HIGH (ref 70–99)
Glucose, Bld: 147 mg/dL — ABNORMAL HIGH (ref 70–99)
Potassium: 2.5 mmol/L — CL (ref 3.5–5.1)
Potassium: 3.1 mmol/L — ABNORMAL LOW (ref 3.5–5.1)
Sodium: 132 mmol/L — ABNORMAL LOW (ref 135–145)
Sodium: 135 mmol/L (ref 135–145)

## 2024-09-28 LAB — PHOSPHORUS: Phosphorus: 2.6 mg/dL (ref 2.5–4.6)

## 2024-09-28 LAB — CBC
HCT: 26.1 % — ABNORMAL LOW (ref 36.0–46.0)
Hemoglobin: 9.1 g/dL — ABNORMAL LOW (ref 12.0–15.0)
MCH: 34.1 pg — ABNORMAL HIGH (ref 26.0–34.0)
MCHC: 34.9 g/dL (ref 30.0–36.0)
MCV: 97.8 fL (ref 80.0–100.0)
Platelets: 334 K/uL (ref 150–400)
RBC: 2.67 MIL/uL — ABNORMAL LOW (ref 3.87–5.11)
RDW: 14.3 % (ref 11.5–15.5)
WBC: 5.5 K/uL (ref 4.0–10.5)
nRBC: 0 % (ref 0.0–0.2)

## 2024-09-28 LAB — MAGNESIUM: Magnesium: 4.5 mg/dL — ABNORMAL HIGH (ref 1.7–2.4)

## 2024-09-28 LAB — C-REACTIVE PROTEIN: CRP: <0.5 mg/dL (ref <1.0)

## 2024-09-28 LAB — VITAMIN B12: Vitamin B-12: 453 pg/mL (ref 180–914)

## 2024-09-28 LAB — HIV ANTIBODY (ROUTINE TESTING W REFLEX): HIV Screen 4th Generation wRfx: NONREACTIVE

## 2024-09-28 LAB — FOLATE: Folate: >20 ng/mL (ref >5.9)

## 2024-09-28 MED ORDER — POTASSIUM CHLORIDE CRYS ER 20 MEQ PO TBCR
40.0000 meq | EXTENDED_RELEASE_TABLET | Freq: Once | ORAL | Status: AC
  Administered 2024-09-28: 40 meq via ORAL
  Filled 2024-09-28: qty 2

## 2024-09-28 MED ORDER — DEXTROSE 5 % IV SOLN
15.0000 mmol | Freq: Once | INTRAVENOUS | Status: AC
  Administered 2024-09-28: 15 mmol via INTRAVENOUS
  Filled 2024-09-28: qty 5

## 2024-09-28 MED ORDER — SODIUM BICARBONATE 8.4 % IV SOLN
INTRAVENOUS | Status: DC
  Filled 2024-09-28 (×2): qty 150
  Filled 2024-09-28: qty 1000
  Filled 2024-09-28: qty 150
  Filled 2024-09-28 (×3): qty 1000

## 2024-09-28 MED ORDER — PROSOURCE PLUS PO LIQD
30.0000 mL | Freq: Three times a day (TID) | ORAL | Status: DC
  Administered 2024-09-28 – 2024-10-02 (×11): 30 mL via ORAL

## 2024-09-28 MED ORDER — PSYLLIUM 95 % PO PACK
1.0000 | PACK | Freq: Two times a day (BID) | ORAL | Status: DC
  Administered 2024-09-28 – 2024-10-01 (×7): 1 via ORAL
  Filled 2024-09-28 (×9): qty 1

## 2024-09-28 MED ORDER — POTASSIUM CHLORIDE 10 MEQ/100ML IV SOLN
10.0000 meq | INTRAVENOUS | Status: AC
  Administered 2024-09-28 (×4): 10 meq via INTRAVENOUS
  Filled 2024-09-28 (×3): qty 100

## 2024-09-28 MED ORDER — POTASSIUM CHLORIDE CRYS ER 20 MEQ PO TBCR
20.0000 meq | EXTENDED_RELEASE_TABLET | ORAL | Status: AC
  Administered 2024-09-28 (×2): 20 meq via ORAL
  Filled 2024-09-28 (×2): qty 1

## 2024-09-28 MED ORDER — ZINC OXIDE 40 % EX OINT
TOPICAL_OINTMENT | Freq: Two times a day (BID) | CUTANEOUS | Status: DC
  Filled 2024-09-28 (×2): qty 113

## 2024-09-28 NOTE — Progress Notes (Signed)
 Initial Nutrition Assessment  DOCUMENTATION CODES:   Underweight, Severe malnutrition in context of chronic illness  INTERVENTION:   -Continue regular diet for widest variety of meal selections -MVI with minerals daily -1 mg folic acid daily -Continue 100 mg thiamine  daily -30 ml Prosource Plus TID, each supplement provides 100 kcals and 15 grams protein -Draw labs to monitor for micronutrient deficiencies secondary to persistent diarrhea: zinc, niacin, folate, and vitamin A -Consult to spiritual care for additional supportive presence  NUTRITION DIAGNOSIS:   Severe Malnutrition related to chronic illness (ETOH abuse) as evidenced by moderate fat depletion, severe fat depletion, moderate muscle depletion, severe muscle depletion.  GOAL:   Patient will meet greater than or equal to 90% of their needs  MONITOR:   PO intake, Supplement acceptance  REASON FOR ASSESSMENT:   Consult Assessment of nutrition requirement/status  ASSESSMENT:   Pt with medical history significant of alcohol and tobacco abuse, CKD-3a, depression, adjustment disorder, anemia, recurrent UTI, who presents with diarrhea.  Pt admitted with severe electrolyte disturbances.   Reviewed I/O's: +232 ml x 24 hours   Per H&P, pt complains of diarrhea for over a month, with more than 10 instance of loose tools per day. She endorses a 15-20# loss over the past 1-2 months.   Case discussed with RN.   Spoke with pt and brother at bedside. Pt brother reports that pt has experienced a general decline in health since June. Brother shares that there is a lot of complexity to pt illness, as she lives alone, drinks and smokes heavily, and also lost her sister back in May, which he suspects is highly contributing to this. Brother shares that he feels like pt has some element of depression secondary to grief processing the loss of her sister, who pt was very close to. Of note, brother mentions similar presentation in which  pt has hospitalized for 2 weeks for similar symptoms about 2 years ago.   Pt reports horrible appetite for the past few months PTA. Pt endorses that she was consuming hamburgers, hot dogs, and chips over the past few months and stopped eating because she tired of them. Pt brother reports that he has not seen her ina  few months due to vacation and business travel, but has noted a decline in her health. He has been bringing her food, but she has been refusing to eat it. Since being admitted, intake has improved; pt consumed most of her breakfast. Documented meal completions 95%. Pt already ordered her lunch and made mention to this RD several times that she is excited for her fruit cup that she ordered.   Per MD, suspect diarrhea to be nutritional in nature; low suspicion for infectious etiology. MD also concerned about electrolyte abnormalities. Pt is at high refeeding risk given malnutrition. RD will also check labs to rule out any micronutrient deficiencies secondary to diarrhea. Pharmacy also following for electrolyte management.   No history of distant wt loss. No recent wt hx available to assess at this time.   Pt expresses frustration of being unable to go home. Pt bother reports he is afraid that pt will continue to decline, as she goes home also to excessive use tobacco and alcohol. RD discussed pt importance of staying in the hospital to be able to get her care she needs to recover. Pt does not like Ensure or Boost Breeze. RD reviewed supplements on formulary. Pt amenable to [employer].   Medications reviewed and include folic acid, ativan,  metamucil, and sodium bicarbonate , and thiamine .   Labs reviewed: Na: 132, K: 2.5. Mg: 4.5.    NUTRITION - FOCUSED PHYSICAL EXAM:  Flowsheet Row Most Recent Value  Orbital Region Moderate depletion  Upper Arm Region Severe depletion  Thoracic and Lumbar Region Severe depletion  Buccal Region Severe depletion  Temple Region Moderate  depletion  Clavicle Bone Region Severe depletion  Clavicle and Acromion Bone Region Severe depletion  Scapular Bone Region Severe depletion  Dorsal Hand Severe depletion  Patellar Region Severe depletion  Anterior Thigh Region Severe depletion  Posterior Calf Region Severe depletion  Edema (RD Assessment) None  Hair Reviewed  Eyes Reviewed  Mouth Reviewed  Skin Reviewed  Nails Reviewed    Diet Order:   Diet Order             Diet regular Fluid consistency: Thin  Diet effective now                   EDUCATION NEEDS:   Education needs have been addressed  Skin:  Skin Assessment: Reviewed RN Assessment  Last BM:  09/28/24 (type 6)  Height:   Ht Readings from Last 1 Encounters:  09/27/24 5' 6 (1.676 m)    Weight:   Wt Readings from Last 1 Encounters:  09/27/24 43.1 kg    Ideal Body Weight:  59.1 kg  BMI:  Body mass index is 15.33 kg/m.  Estimated Nutritional Needs:   Kcal:  1700-1900  Protein:  85-100 grams  Fluid:  1.7-1.9 L    Margery ORN, RD, LDN, CDCES Registered Dietitian III Certified Diabetes Care and Education Specialist If unable to reach this RD, please use RD Inpatient group chat on secure chat between hours of 8am-4 pm daily

## 2024-09-28 NOTE — Progress Notes (Signed)
 PROGRESS NOTE    Karen Mooney  FMW:[id number] DOB: [birth year] DOA: 09/27/2024 PCP: [facility]    Brief Narrative:  63 y.o. female with medical history significant of alcohol and tobacco abuse, CKD-3a, depression, adjustment disorder, anemia, recurrent UTI, who presents with diarrhea.   Patient states that she has diarrhea for more than 1 month, with more than 10 times of loose stool bowel movement each day.  Since yesterday she has had 2 episode of nonbilious nonbloody vomiting, with nausea.  Denies abdominal pain.  No chest pain, cough, SOB.  She reports mild burning sensation on urination, no dysuria or hematuria.  No fever or chills.  She reports chronic left shoulder pain.  Patient has dizziness and lightheadedness.  No unilateral numbness or tingling extremities.  No facial droop or slurred speech.  Patient reports 15-20 pounds weight loss over the past 1 or 2 months.  She has poor appetite and decreased oral intake.   Assessment & Plan:   Principal Problem:   Electrolyte disturbance Active Problems:   Hyponatremia   Hypokalemia   Hypomagnesemia   Hypocalcemia   Hypophosphatemia   Nausea vomiting and diarrhea   Dehydration   Acute renal failure superimposed on stage 3a chronic kidney disease (HCC)   Metabolic acidosis   Macrocytic anemia   Left shoulder pain   Tobacco use disorder   Alcohol abuse   Protein-calorie malnutrition, severe  Electrolyte disturbance including hyponatremia, hypokalemia, hypomagnesemia, hypocalcemia and hypophosphatemia:  Non-anion gap metabolic acidosis  potassium <2.0, magnesium  1.1, sodium 131, calcium 6.3, phosphorus 2.1 This is due to chronic diarrhea that I suspect to be nutritional in nature.  Low suspicion for infectious etiology but unable to totally exclude at this point. Plan: Bicarbonate drip Pharmacy consult for electrolyte replacement RD consultation Rule out infectious diarrhea  Nausea vomiting and  diarrhea:  C. difficile and GI PCR panel sent.  Suspicion is low.  Will rule out.   Dehydration IVF   Acute renal failure superimposed on stage 3a chronic kidney disease (HCC): Baseline creatinine 1.19 on 09/01/2021.  Her creatinine is at 2.01, BUN 30, GFR 27 today.  Likely due to dehydration IVF     Macrocytic anemia: Hemoglobin 8.8 on 09/01/2024.  Her creatinine is 9.4 today.    Left shoulder pain: X-ray showed minor acromioclavicular spurring, and slight superior subluxation of the humeral head may represent rotator cuff arthropathy. - Lidoderm  patch - As needed oxycodone and Tylenol  for pain - May need to follow-up with Ortho   Tobacco use disorder and alcohol abuse: - Due to counseling about importance of quitting smoking and alcohol use - Nicotine  patch - CIWA protocol   Protein-calorie malnutrition, severe: Patient body weight 43.1 kg, BMI 15.33 - Ensure - Nutrition consult    DVT prophylaxis: SQ lovenox Code Status: Full Family Communication:None today Disposition Plan: Status is: Inpatient Remains inpatient appropriate because: Chronic diarrhea with acute worsening.  Severe electrolyte abnormalities   Level of care: Telemetry Medical  Consultants:  None  Procedures:  None  Antimicrobials: None   Subjective: Seen and examined.  Energy level low.  Otherwise stable.  Objective: Vitals:   09/28/24 0453 09/28/24 0735 09/28/24 0800 09/28/24 1129  BP: 124/74 126/67 120/70 116/64  Pulse: 73 80 82 (!) 109  Resp:  18    Temp:  97.7 F (36.5 C)  97.8 F (36.6 C)  TempSrc:    Oral  SpO2: 99% 100%  100%  Weight:      Height:  Intake/Output Summary (Last 24 hours) at 09/28/2024 1305 Last data filed at 09/28/2024 1018 Gross per 24 hour  Intake 332.36 ml  Output --  Net 332.36 ml   Filed Weights   09/27/24 1649 09/27/24 2003  Weight: 39.7 kg 43.1 kg    Examination:  General exam: NAD.  Appears fatigued Respiratory system: Clear.  Normal work  breathing.  Room air Cardiovascular system: S1-S2, RRR, no murmurs, no pedal edema Gastrointestinal system: Thin, soft, NT/ND, normal bowel sounds Central nervous system: Alert and oriented. No focal neurological deficits. Extremities: Symmetric 5 x 5 power. Skin: No rashes, lesions or ulcers Psychiatry: Judgement and insight appear normal. Mood & affect appropriate.     Data Reviewed: I have personally reviewed following labs and imaging studies  CBC: Recent Labs  Lab 09/27/24 1717 09/28/24 0528  WBC 8.0 5.5  NEUTROABS 6.3  --   HGB 9.4* 9.1*  HCT 27.4* 26.1*  MCV 100.7* 97.8  PLT 351 334   Basic Metabolic Panel: Recent Labs  Lab 09/27/24 1717 09/27/24 2132 09/28/24 0528  NA 131*  --  132*  K <2.0* 3.2* 2.5*  CL 102  --  106  CO2 12*  --  12*  GLUCOSE 105*  --  103*  BUN 30*  --  31*  CREATININE 2.01*  --  1.76*  CALCIUM 6.3*  --  6.7*  MG 1.0*  --  4.5*  PHOS 2.1*  --  2.6   GFR: Estimated Creatinine Clearance: 22.3 mL/min (A) (by C-G formula based on SCr of 1.76 mg/dL (H)). Liver Function Tests: Recent Labs  Lab 09/27/24 1717  AST 24  ALT 13  ALKPHOS 109  BILITOT 1.4*  PROT 7.2  ALBUMIN 2.9*   Recent Labs  Lab 09/27/24 1717  LIPASE 47   No results for input(s): AMMONIA in the last 168 hours. Coagulation Profile: No results for input(s): INR, PROTIME in the last 168 hours. Cardiac Enzymes: No results for input(s): CKTOTAL, CKMB, CKMBINDEX, TROPONINI in the last 168 hours. BNP (last 3 results) No results for input(s): PROBNP in the last 8760 hours. HbA1C: No results for input(s): HGBA1C in the last 72 hours. CBG: No results for input(s): GLUCAP in the last 168 hours. Lipid Profile: No results for input(s): CHOL, HDL, LDLCALC, TRIG, CHOLHDL, LDLDIRECT in the last 72 hours. Thyroid Function Tests: No results for input(s): TSH, T4TOTAL, FREET4, T3FREE, THYROIDAB in the last 72 hours. Anemia  Panel: Recent Labs    09/27/24 2132 09/27/24 2133  VITAMINB12 453  --   FOLATE 8.1  --   FERRITIN 540*  --   TIBC 183*  --   IRON 108  --   RETICCTPCT  --  1.1   Sepsis Labs: Recent Labs  Lab 09/27/24 2132  LATICACIDVEN 1.4    Recent Results (from the past 240 hours)  Resp panel by RT-PCR (RSV, Flu A&B, Covid) Anterior Nasal Swab     Status: None   Collection Time: 09/28/24 11:59 AM   Specimen: Anterior Nasal Swab  Result Value Ref Range Status   SARS Coronavirus 2 by RT PCR NEGATIVE NEGATIVE Final    Comment: (NOTE) SARS-CoV-2 target nucleic acids are NOT DETECTED.  The SARS-CoV-2 RNA is generally detectable in upper respiratory specimens during the acute phase of infection. The lowest concentration of SARS-CoV-2 viral copies this assay can detect is 138 copies/mL. A negative result does not preclude SARS-Cov-2 infection and should not be used as the sole basis for treatment or other  patient management decisions. A negative result may occur with  improper specimen collection/handling, submission of specimen other than nasopharyngeal swab, presence of viral mutation(s) within the areas targeted by this assay, and inadequate number of viral copies(<138 copies/mL). A negative result must be combined with clinical observations, patient history, and epidemiological information. The expected result is Negative.  Fact Sheet for Patients:  BloggerCourse.com  Fact Sheet for Healthcare Providers:  SeriousBroker.it  This test is no t yet approved or cleared by the [place]  FDA and  has been authorized for detection and/or diagnosis of SARS-CoV-2 by FDA under an Emergency Use Authorization (EUA). This EUA will remain  in effect (meaning this test can be used) for the duration of the COVID-19 declaration under Section 564(b)(1) of the Act, 21 U.S.C.section 360bbb-3(b)(1), unless the authorization is terminated  or revoked  sooner.       Influenza A by PCR NEGATIVE NEGATIVE Final   Influenza B by PCR NEGATIVE NEGATIVE Final    Comment: (NOTE) The Xpert Xpress SARS-CoV-2/FLU/RSV plus assay is intended as an aid in the diagnosis of influenza from Nasopharyngeal swab specimens and should not be used as a sole basis for treatment. Nasal washings and aspirates are unacceptable for Xpert Xpress SARS-CoV-2/FLU/RSV testing.  Fact Sheet for Patients: BloggerCourse.com  Fact Sheet for Healthcare Providers: SeriousBroker.it  This test is not yet approved or cleared by the [place]  FDA and has been authorized for detection and/or diagnosis of SARS-CoV-2 by FDA under an Emergency Use Authorization (EUA). This EUA will remain in effect (meaning this test can be used) for the duration of the COVID-19 declaration under Section 564(b)(1) of the Act, 21 U.S.C. section 360bbb-3(b)(1), unless the authorization is terminated or revoked.     Resp Syncytial Virus by PCR NEGATIVE NEGATIVE Final    Comment: (NOTE) Fact Sheet for Patients: BloggerCourse.com  Fact Sheet for Healthcare Providers: SeriousBroker.it  This test is not yet approved or cleared by the [place]  FDA and has been authorized for detection and/or diagnosis of SARS-CoV-2 by FDA under an Emergency Use Authorization (EUA). This EUA will remain in effect (meaning this test can be used) for the duration of the COVID-19 declaration under Section 564(b)(1) of the Act, 21 U.S.C. section 360bbb-3(b)(1), unless the authorization is terminated or revoked.  Performed at [facility], [address], [place], [place] [address]          Radiology Studies: DG Shoulder Left Result Date: 09/27/2024 CLINICAL DATA:  Atraumatic left shoulder pain. EXAM: LEFT SHOULDER - 2+ VIEW COMPARISON:  None Available. FINDINGS: The bones are subjectively  under mineralized there is no evidence of fracture or dislocation. Minor acromioclavicular spurring. Slight superior subluxation of the humeral head. Soft tissues are unremarkable. IMPRESSION: Minor acromioclavicular spurring. Slight superior subluxation of the humeral head may represent rotator cuff arthropathy. Electronically Signed   By: Andrea Gasman M.D.   On: 09/27/2024 18:46        Scheduled Meds:  enoxaparin (LOVENOX) injection  30 mg Subcutaneous Q24H   feeding supplement  237 mL Oral BID BM   folic acid  1 mg Oral Daily   lidocaine   1 patch Transdermal Q24H   liver oil-zinc oxide   Topical BID   LORazepam  0-4 mg Intravenous Q6H   Followed by   NOREEN ON 09/29/2024] LORazepam  0-4 mg Intravenous Q12H   multivitamin with minerals  1 tablet Oral Daily   nicotine   21 mg Transdermal Daily   psyllium  1 packet  Oral BID   thiamine   100 mg Oral Daily   Or   thiamine   100 mg Intravenous Daily   Continuous Infusions:  potassium chloride  10 mEq (09/28/24 1237)   sodium bicarbonate  150 mEq in dextrose  5 % 1,150 mL infusion 75 mL/hr at 09/28/24 0932     LOS: 1 day       Calvin KATHEE Robson, MD [facility]   If 7PM-7AM, please contact night-coverage  09/28/2024, 1:05 PM

## 2024-09-28 NOTE — Progress Notes (Signed)
   09/28/24 1600  Spiritual Encounters  Type of Visit Initial  Care provided to: Patient  Reason for visit Routine spiritual support  OnCall Visit No  Interventions  Spiritual Care Interventions Made Established relationship of care and support;Compassionate presence  Intervention Outcomes  Outcomes Connection to spiritual care   Chaplain provided compassionate presence to the patient. Patient did not want to engage. Patient gave very short answers to all questions. Chaplain let patient know if they wanted a visit from the chaplain someone would come just page.

## 2024-09-28 NOTE — Progress Notes (Signed)
 Patient is alert and oriented X 4.. Her heart rate goes up to 130 while walking. MD made aware. No any new order received. Plan of care ongoing.

## 2024-09-28 NOTE — Progress Notes (Addendum)
 PHARMACY CONSULT NOTE - ELECTROLYTES  Pharmacy Consult for Electrolyte Monitoring and Replacement   Recent Labs: Height: 5' 6 (167.6 cm) Weight: 43.1 kg (95 lb) IBW/kg (Calculated) : 59.3 Estimated Creatinine Clearance: 18.8 mL/min (A) (by C-G formula based on SCr of 2.08 mg/dL (H)). Potassium (mmol/L)  Date Value  09/28/2024 3.1 (L)   Magnesium  (mg/dL)  Date Value  89/93/7974 4.5 (H)   Calcium (mg/dL)  Date Value  89/93/7974 7.1 (L)   Albumin (g/dL)  Date Value  89/94/7974 2.9 (L)   Phosphorus (mg/dL)  Date Value  89/93/7974 2.6   Sodium (mmol/L)  Date Value  09/28/2024 135   Corrected Ca: 7.2 mg/dL  Assessment  Karen Mooney is a 63 y.o. female presenting with diarrhea and vomiting. PMH significant for hyponatremia, recurrent UTIs, tobacco use. Pharmacy has been consulted to monitor and replace electrolytes.  Diet: Regular; Feeding supplements MIVF: Sodium bicarb in D5W @ 100 mL/hr Pertinent medications: MVI  Goal of Therapy: Electrolytes WNL  Plan:  K 3.1 >> will order additional 20 mEq PO x 2 doses Check BMP including corrected Ca, along with Mg, Phos with AM labs  Thank you for allowing pharmacy to be a part of this patient's care.  Will M. Lenon, PharmD, BCPS Clinical Pharmacist 09/28/2024 3:14 PM

## 2024-09-28 NOTE — Progress Notes (Addendum)
 PHARMACY CONSULT NOTE - ELECTROLYTES  Pharmacy Consult for Electrolyte Monitoring and Replacement   Recent Labs: Height: 5' 6 (167.6 cm) Weight: 43.1 kg (95 lb) IBW/kg (Calculated) : 59.3 Estimated Creatinine Clearance: 22.3 mL/min (A) (by C-G formula based on SCr of 1.76 mg/dL (H)). Potassium (mmol/L)  Date Value  09/28/2024 2.5 (LL)   Magnesium  (mg/dL)  Date Value  89/93/7974 4.5 (H)   Calcium (mg/dL)  Date Value  89/93/7974 6.7 (L)   Albumin (g/dL)  Date Value  89/94/7974 2.9 (L)   Phosphorus (mg/dL)  Date Value  89/93/7974 2.6   Sodium (mmol/L)  Date Value  09/28/2024 132 (L)   Corrected Ca: 7.2 mg/dL  Assessment  Karen Mooney is a 63 y.o. female presenting with diarrhea and vomiting. PMH significant for hyponatremia, recurrent UTIs, tobacco use. Pharmacy has been consulted to monitor and replace electrolytes.  Diet: Regular; Feeding supplements MIVF: Sodium bicarb in D5W @ 100 mL/hr Pertinent medications: MVI  Goal of Therapy: Electrolytes WNL  Plan:  K 2.5 >> potassium chloride  10mEq IV x 4 plus 40mEq PO x 1 ordered by provider Recheck K today at 1400 Check BMP including corrected Ca, along with Mg, Phos with AM labs  Thank you for allowing pharmacy to be a part of this patient's care.  Will M. Lenon, PharmD, BCPS Clinical Pharmacist 09/28/2024 7:27 AM

## 2024-09-29 LAB — BASIC METABOLIC PANEL WITH GFR
Anion gap: 10 (ref 5–15)
BUN: 29 mg/dL — ABNORMAL HIGH (ref 8–23)
CO2: 17 mmol/L — ABNORMAL LOW (ref 22–32)
Calcium: 7 mg/dL — ABNORMAL LOW (ref 8.9–10.3)
Chloride: 107 mmol/L (ref 98–111)
Creatinine, Ser: 1.79 mg/dL — ABNORMAL HIGH (ref 0.44–1.00)
GFR, Estimated: 31 mL/min — ABNORMAL LOW (ref >60)
Glucose, Bld: 96 mg/dL (ref 70–99)
Potassium: 3 mmol/L — ABNORMAL LOW (ref 3.5–5.1)
Sodium: 134 mmol/L — ABNORMAL LOW (ref 135–145)

## 2024-09-29 LAB — ALBUMIN: Albumin: 2.2 g/dL — ABNORMAL LOW (ref 3.5–5.0)

## 2024-09-29 LAB — PHOSPHORUS: Phosphorus: 1.8 mg/dL — ABNORMAL LOW (ref 2.5–4.6)

## 2024-09-29 LAB — MAGNESIUM: Magnesium: 2.6 mg/dL — ABNORMAL HIGH (ref 1.7–2.4)

## 2024-09-29 LAB — GLUCOSE, CAPILLARY: Glucose-Capillary: 108 mg/dL — ABNORMAL HIGH (ref 70–99)

## 2024-09-29 MED ORDER — CARBAMIDE PEROXIDE 6.5 % OT SOLN
5.0000 [drp] | Freq: Two times a day (BID) | OTIC | Status: DC
  Administered 2024-09-30 – 2024-10-02 (×5): 5 [drp] via OTIC
  Filled 2024-09-29: qty 15

## 2024-09-29 MED ORDER — POTASSIUM PHOSPHATES 15 MMOLE/5ML IV SOLN
30.0000 mmol | Freq: Once | INTRAVENOUS | Status: AC
  Administered 2024-09-29: 30 mmol via INTRAVENOUS
  Filled 2024-09-29: qty 10

## 2024-09-29 NOTE — Progress Notes (Signed)
 PROGRESS NOTE    Karen Mooney  FMW:[id number] DOB: October 03, 1961 DOA: 09/27/2024 PCP: [facility]    Brief Narrative:  63 y.o. female with medical history significant of alcohol and tobacco abuse, CKD-3a, depression, adjustment disorder, anemia, recurrent UTI, who presents with diarrhea.   Patient states that she has diarrhea for more than 1 month, with more than 10 times of loose stool bowel movement each day.  Since yesterday she has had 2 episode of nonbilious nonbloody vomiting, with nausea.  Denies abdominal pain.  No chest pain, cough, SOB.  She reports mild burning sensation on urination, no dysuria or hematuria.  No fever or chills.  She reports chronic left shoulder pain.  Patient has dizziness and lightheadedness.  No unilateral numbness or tingling extremities.  No facial droop or slurred speech.  Patient reports 15-20 pounds weight loss over the past 1 or 2 months.  She has poor appetite and decreased oral intake.   Assessment & Plan:   Principal Problem:   Electrolyte disturbance Active Problems:   Hyponatremia   Hypokalemia   Hypomagnesemia   Hypocalcemia   Hypophosphatemia   Nausea vomiting and diarrhea   Dehydration   Acute renal failure superimposed on stage 3a chronic kidney disease (HCC)   Metabolic acidosis   Macrocytic anemia   Left shoulder pain   Tobacco use disorder   Alcohol abuse   Protein-calorie malnutrition, severe  Electrolyte disturbance including hyponatremia, hypokalemia, hypomagnesemia, hypocalcemia and hypophosphatemia:  Non-anion gap metabolic acidosis  potassium <2.0, magnesium  1.1, sodium 131, calcium 6.3, phosphorus 2.1 This is due to chronic diarrhea that I suspect to be nutritional in nature.  Low suspicion for infectious etiology but unable to totally exclude at this point. Plan: Continue bicarbonate drip.  Appreciate pharmacy assistance and monitoring for electrolyte replacement.  Appreciate RD consultation and  recommendations.  Infectious diarrhea workup continues to be pending.  Follow-up C. difficile and GI PCR panel.   Nausea vomiting and diarrhea:  C. difficile and GI PCR panel sent.  Continues to be pending.  Suspicion is rather low for infectious diarrhea but will rule out.   Dehydration IVF   Acute renal failure superimposed on stage 3a chronic kidney disease (HCC): Baseline creatinine 1.19 on 09/01/2021.  Her creatinine is at 2.01, BUN 30, GFR 27 today.  Likely due to dehydration IVF     Macrocytic anemia: Hemoglobin 8.8 on 09/01/2024.  Her creatinine is 9.4 today.    Left shoulder pain:  X-ray showed minor acromioclavicular spurring, and slight superior subluxation of the humeral head may represent rotator cuff arthropathy. - Lidoderm  patch - As needed oxycodone and Tylenol  for pain    Tobacco use disorder and alcohol abuse: - Due to counseling about importance of quitting smoking and alcohol use - Nicotine  patch - CIWA protocol.  No current evidence of withdrawal.  Consider discontinuation of CIWA protocol if not scoring over next 24 hours   Protein-calorie malnutrition, severe: Patient body weight 43.1 kg, BMI 15.33 - Ensure - Nutrition consult    DVT prophylaxis: SQ lovenox Code Status: Full Family Communication: Brother at bedside 10/7 Disposition Plan: Status is: Inpatient Remains inpatient appropriate because: Chronic diarrhea with acute worsening.  Severe electrolyte abnormalities   Level of care: Telemetry Medical  Consultants:  None  Procedures:  None  Antimicrobials: None   Subjective: Seen and examined.  Energy level low.  Brother at bedside.  Otherwise stable.  Objective: Vitals:   09/28/24 2112 09/28/24 2310 09/29/24 0538 09/29/24 1226  BP: 120/65  123/64 118/75  Pulse: (!) 104 (!) 107 94 89  Resp: (!) 22     Temp: 98 F (36.7 C) 98.5 F (36.9 C)  97.9 F (36.6 C)  TempSrc: Oral Oral  Oral  SpO2: 100%   98%  Weight:      Height:         Intake/Output Summary (Last 24 hours) at 09/29/2024 1451 Last data filed at 09/29/2024 1300 Gross per 24 hour  Intake 360 ml  Output --  Net 360 ml   Filed Weights   09/27/24 1649 09/27/24 2003  Weight: 39.7 kg 43.1 kg    Examination:  General exam: No acute distress.  Appears fatigued Respiratory system: Clear.  Normal work breathing.  Room air Cardiovascular system: S1-S2, RRR, no murmurs, no pedal edema Gastrointestinal system: Thin, soft, NT/ND, normal bowel sounds Central nervous system: Alert and oriented. No focal neurological deficits. Extremities: Symmetric 5 x 5 power. Skin: No rashes, lesions or ulcers Psychiatry: Judgement and insight appear normal. Mood & affect flattened.     Data Reviewed: I have personally reviewed following labs and imaging studies  CBC: Recent Labs  Lab 09/27/24 1717 09/28/24 0528  WBC 8.0 5.5  NEUTROABS 6.3  --   HGB 9.4* 9.1*  HCT 27.4* 26.1*  MCV 100.7* 97.8  PLT 351 334   Basic Metabolic Panel: Recent Labs  Lab 09/27/24 1717 09/27/24 2132 09/28/24 0528 09/28/24 1434 09/29/24 0440  NA 131*  --  132* 135 134*  K <2.0* 3.2* 2.5* 3.1* 3.0*  CL 102  --  106 109 107  CO2 12*  --  12* 13* 17*  GLUCOSE 105*  --  103* 147* 96  BUN 30*  --  31* 32* 29*  CREATININE 2.01*  --  1.76* 2.08* 1.79*  CALCIUM 6.3*  --  6.7* 7.1* 7.0*  MG 1.0*  --  4.5*  --  2.6*  PHOS 2.1*  --  2.6  --  1.8*   GFR: Estimated Creatinine Clearance: 21.9 mL/min (A) (by C-G formula based on SCr of 1.79 mg/dL (H)). Liver Function Tests: Recent Labs  Lab 09/27/24 1717 09/29/24 0440  AST 24  --   ALT 13  --   ALKPHOS 109  --   BILITOT 1.4*  --   PROT 7.2  --   ALBUMIN 2.9* 2.2*   Recent Labs  Lab 09/27/24 1717  LIPASE 47   No results for input(s): AMMONIA in the last 168 hours. Coagulation Profile: No results for input(s): INR, PROTIME in the last 168 hours. Cardiac Enzymes: No results for input(s): CKTOTAL, CKMB,  CKMBINDEX, TROPONINI in the last 168 hours. BNP (last 3 results) No results for input(s): PROBNP in the last 8760 hours. HbA1C: No results for input(s): HGBA1C in the last 72 hours. CBG: Recent Labs  Lab 09/29/24 1216  GLUCAP 108*   Lipid Profile: No results for input(s): CHOL, HDL, LDLCALC, TRIG, CHOLHDL, LDLDIRECT in the last 72 hours. Thyroid Function Tests: No results for input(s): TSH, T4TOTAL, FREET4, T3FREE, THYROIDAB in the last 72 hours. Anemia Panel: Recent Labs    09/27/24 2132 09/27/24 2133 09/28/24 1624  VITAMINB12 453  --   --   FOLATE 8.1  --  >20.0  FERRITIN 540*  --   --   TIBC 183*  --   --   IRON 108  --   --   RETICCTPCT  --  1.1  --    Sepsis Labs: Recent Labs  Lab 09/27/24 2132  LATICACIDVEN 1.4    Recent Results (from the past 240 hours)  Resp panel by RT-PCR (RSV, Flu A&B, Covid) Anterior Nasal Swab     Status: None   Collection Time: 09/28/24 11:59 AM   Specimen: Anterior Nasal Swab  Result Value Ref Range Status   SARS Coronavirus 2 by RT PCR NEGATIVE NEGATIVE Final    Comment: (NOTE) SARS-CoV-2 target nucleic acids are NOT DETECTED.  The SARS-CoV-2 RNA is generally detectable in upper respiratory specimens during the acute phase of infection. The lowest concentration of SARS-CoV-2 viral copies this assay can detect is 138 copies/mL. A negative result does not preclude SARS-Cov-2 infection and should not be used as the sole basis for treatment or other patient management decisions. A negative result may occur with  improper specimen collection/handling, submission of specimen other than nasopharyngeal swab, presence of viral mutation(s) within the areas targeted by this assay, and inadequate number of viral copies(<138 copies/mL). A negative result must be combined with clinical observations, patient history, and epidemiological information. The expected result is Negative.  Fact Sheet for Patients:   BloggerCourse.com  Fact Sheet for Healthcare Providers:  SeriousBroker.it  This test is no t yet approved or cleared by the [place]  FDA and  has been authorized for detection and/or diagnosis of SARS-CoV-2 by FDA under an Emergency Use Authorization (EUA). This EUA will remain  in effect (meaning this test can be used) for the duration of the COVID-19 declaration under Section 564(b)(1) of the Act, 21 U.S.C.section 360bbb-3(b)(1), unless the authorization is terminated  or revoked sooner.       Influenza A by PCR NEGATIVE NEGATIVE Final   Influenza B by PCR NEGATIVE NEGATIVE Final    Comment: (NOTE) The Xpert Xpress SARS-CoV-2/FLU/RSV plus assay is intended as an aid in the diagnosis of influenza from Nasopharyngeal swab specimens and should not be used as a sole basis for treatment. Nasal washings and aspirates are unacceptable for Xpert Xpress SARS-CoV-2/FLU/RSV testing.  Fact Sheet for Patients: BloggerCourse.com  Fact Sheet for Healthcare Providers: SeriousBroker.it  This test is not yet approved or cleared by the [place]  FDA and has been authorized for detection and/or diagnosis of SARS-CoV-2 by FDA under an Emergency Use Authorization (EUA). This EUA will remain in effect (meaning this test can be used) for the duration of the COVID-19 declaration under Section 564(b)(1) of the Act, 21 U.S.C. section 360bbb-3(b)(1), unless the authorization is terminated or revoked.     Resp Syncytial Virus by PCR NEGATIVE NEGATIVE Final    Comment: (NOTE) Fact Sheet for Patients: BloggerCourse.com  Fact Sheet for Healthcare Providers: SeriousBroker.it  This test is not yet approved or cleared by the [place]  FDA and has been authorized for detection and/or diagnosis of SARS-CoV-2 by FDA under an Emergency Use  Authorization (EUA). This EUA will remain in effect (meaning this test can be used) for the duration of the COVID-19 declaration under Section 564(b)(1) of the Act, 21 U.S.C. section 360bbb-3(b)(1), unless the authorization is terminated or revoked.  Performed at [facility], [address], [place], [place] [address]          Radiology Studies: DG Shoulder Left Result Date: 09/27/2024 CLINICAL DATA:  Atraumatic left shoulder pain. EXAM: LEFT SHOULDER - 2+ VIEW COMPARISON:  None Available. FINDINGS: The bones are subjectively under mineralized there is no evidence of fracture or dislocation. Minor acromioclavicular spurring. Slight superior subluxation of the humeral head. Soft tissues are unremarkable. IMPRESSION: Minor acromioclavicular  spurring. Slight superior subluxation of the humeral head may represent rotator cuff arthropathy. Electronically Signed   By: Andrea Gasman M.D.   On: 09/27/2024 18:46        Scheduled Meds:  (feeding supplement) PROSource Plus  30 mL Oral TID BM   enoxaparin (LOVENOX) injection  30 mg Subcutaneous Q24H   folic acid  1 mg Oral Daily   lidocaine   1 patch Transdermal Q24H   liver oil-zinc oxide   Topical BID   LORazepam  0-4 mg Intravenous Q6H   Followed by   LORazepam  0-4 mg Intravenous Q12H   multivitamin with minerals  1 tablet Oral Daily   nicotine   21 mg Transdermal Daily   psyllium  1 packet Oral BID   thiamine   100 mg Oral Daily   Or   thiamine   100 mg Intravenous Daily   Continuous Infusions:  potassium PHOSPHATE  IVPB (in mmol) 30 mmol (09/29/24 0909)   sodium bicarbonate  150 mEq in dextrose  5 % 1,150 mL infusion 75 mL/hr at 09/29/24 0544     LOS: 2 days       Calvin KATHEE Robson, MD [facility]   If 7PM-7AM, please contact night-coverage  09/29/2024, 2:51 PM

## 2024-09-29 NOTE — Plan of Care (Signed)
  Problem: Pain Managment: Goal: General experience of comfort will improve and/or be controlled Outcome: Progressing   Problem: Safety: Goal: Ability to remain free from injury will improve Outcome: Progressing

## 2024-09-29 NOTE — Plan of Care (Signed)
  Problem: Education: Goal: Knowledge of General Education information will improve Description: Including pain rating scale, medication(s)/side effects and non-pharmacologic comfort measures Outcome: Progressing   Problem: Clinical Measurements: Goal: Ability to maintain clinical measurements within normal limits will improve Outcome: Progressing Goal: Will remain free from infection Outcome: Progressing   Problem: Coping: Goal: Level of anxiety will decrease Outcome: Progressing   Problem: Safety: Goal: Ability to remain free from injury will improve Outcome: Progressing   Problem: Skin Integrity: Goal: Risk for impaired skin integrity will decrease Outcome: Progressing

## 2024-09-29 NOTE — TOC Progression Note (Signed)
 Transition of Care [facility]) - Progression Note    Patient Details  Name: Karen Mooney MRN: [id number] Date of Birth: 1961-07-21  Transition of [facility]) CM/SW Contact  Dalia GORMAN Fuse, RN Phone Number: 09/29/2024, 1:01 PM  Clinical Narrative:    Remains inpt appropriate being workup for diarrhea and associated electrolyte abnormality, and protein calorie malnutrition.                       Expected Discharge Plan and Services                                               Social Drivers of Health (SDOH) Interventions SDOH Screenings   Food Insecurity: No Food Insecurity (09/27/2024)  Housing: Unknown (09/27/2024)  Transportation Needs: No Transportation Needs (09/27/2024)  Utilities: Not At Risk (09/27/2024)  Tobacco Use: High Risk (09/27/2024)    Readmission Risk Interventions     No data to display

## 2024-09-29 NOTE — Progress Notes (Signed)
 PHARMACY CONSULT NOTE - ELECTROLYTES  Pharmacy Consult for Electrolyte Monitoring and Replacement   Recent Labs: Height: 5' 6 (167.6 cm) Weight: 43.1 kg (95 lb) IBW/kg (Calculated) : 59.3 Estimated Creatinine Clearance: 21.9 mL/min (A) (by C-G formula based on SCr of 1.79 mg/dL (H)). Potassium (mmol/L)  Date Value  09/29/2024 3.0 (L)   Magnesium  (mg/dL)  Date Value  89/92/7974 2.6 (H)   Calcium (mg/dL)  Date Value  89/92/7974 7.0 (L)   Albumin (g/dL)  Date Value  89/92/7974 2.2 (L)   Phosphorus (mg/dL)  Date Value  89/92/7974 1.8 (L)   Sodium (mmol/L)  Date Value  09/29/2024 134 (L)   Corrected Ca: 7.2 mg/dL  Assessment  Karen Mooney is a 63 y.o. female presenting with diarrhea and vomiting. PMH significant for hyponatremia, recurrent UTIs, tobacco use. Pharmacy has been consulted to monitor and replace electrolytes.  Diet: Regular; Feeding supplements MIVF: Sodium bicarb in D5W @ 75 mL/hr Pertinent medications: MVI  Goal of Therapy: Electrolytes WNL  Plan:  K 3.0, Phos 1.8 >> Give Kphos 30 mmol IV x1 Check BMP including corrected Ca, along with Mg, Phos with AM labs  Thank you for involving pharmacy in this patient's care.   Damien Napoleon, PharmD Clinical Pharmacist 09/29/2024 7:19 AM

## 2024-09-30 LAB — BASIC METABOLIC PANEL WITH GFR
Anion gap: 10 (ref 5–15)
Anion gap: 12 (ref 5–15)
BUN: 31 mg/dL — ABNORMAL HIGH (ref 8–23)
BUN: 30 mg/dL — ABNORMAL HIGH (ref 8–23)
CO2: 23 mmol/L (ref 22–32)
CO2: 25 mmol/L (ref 22–32)
Calcium: 6.6 mg/dL — ABNORMAL LOW (ref 8.9–10.3)
Calcium: 6.9 mg/dL — ABNORMAL LOW (ref 8.9–10.3)
Chloride: 101 mmol/L (ref 98–111)
Chloride: 99 mmol/L (ref 98–111)
Creatinine, Ser: 1.46 mg/dL — ABNORMAL HIGH (ref 0.44–1.00)
Creatinine, Ser: 1.43 mg/dL — ABNORMAL HIGH (ref 0.44–1.00)
GFR, Estimated: 40 mL/min — ABNORMAL LOW (ref >60)
GFR, Estimated: 41 mL/min — ABNORMAL LOW (ref >60)
Glucose, Bld: 105 mg/dL — ABNORMAL HIGH (ref 70–99)
Glucose, Bld: 107 mg/dL — ABNORMAL HIGH (ref 70–99)
Potassium: 2.6 mmol/L — CL (ref 3.5–5.1)
Potassium: 3.1 mmol/L — ABNORMAL LOW (ref 3.5–5.1)
Sodium: 134 mmol/L — ABNORMAL LOW (ref 135–145)
Sodium: 136 mmol/L (ref 135–145)

## 2024-09-30 LAB — GASTROINTESTINAL PANEL BY PCR, STOOL (REPLACES STOOL CULTURE)

## 2024-09-30 LAB — MAGNESIUM: Magnesium: 1.8 mg/dL (ref 1.7–2.4)

## 2024-09-30 LAB — C DIFFICILE QUICK SCREEN W PCR REFLEX
C Diff antigen: NEGATIVE
C Diff interpretation: NOT DETECTED
C Diff toxin: NEGATIVE

## 2024-09-30 LAB — PHOSPHORUS: Phosphorus: 2.7 mg/dL (ref 2.5–4.6)

## 2024-09-30 LAB — VITAMIN A: Vitamin A (Retinoic Acid): 23.9 ug/dL (ref 22.0–69.5)

## 2024-09-30 MED ORDER — POTASSIUM CHLORIDE 10 MEQ/100ML IV SOLN
10.0000 meq | INTRAVENOUS | Status: AC
  Administered 2024-09-30 (×4): 10 meq via INTRAVENOUS
  Filled 2024-09-30 (×2): qty 100

## 2024-09-30 MED ORDER — POTASSIUM CHLORIDE CRYS ER 20 MEQ PO TBCR
40.0000 meq | EXTENDED_RELEASE_TABLET | ORAL | Status: AC
  Administered 2024-09-30 (×2): 40 meq via ORAL
  Filled 2024-09-30 (×2): qty 2

## 2024-09-30 MED ORDER — CALCIUM GLUCONATE-NACL 1-0.675 GM/50ML-% IV SOLN
1.0000 g | Freq: Once | INTRAVENOUS | Status: AC
  Administered 2024-09-30: 1000 mg via INTRAVENOUS
  Filled 2024-09-30 (×2): qty 50

## 2024-09-30 MED ORDER — LOPERAMIDE HCL 2 MG PO CAPS
2.0000 mg | ORAL_CAPSULE | Freq: Two times a day (BID) | ORAL | Status: DC | PRN
  Administered 2024-09-30: 2 mg via ORAL
  Filled 2024-09-30: qty 1

## 2024-09-30 MED ORDER — POTASSIUM CHLORIDE 10 MEQ/100ML IV SOLN
10.0000 meq | INTRAVENOUS | Status: DC
  Filled 2024-09-30 (×2): qty 100

## 2024-09-30 MED ORDER — SODIUM CHLORIDE 0.9 % IV SOLN: INTRAVENOUS | Status: AC

## 2024-09-30 NOTE — Progress Notes (Signed)
  PROGRESS NOTE    Karen Mooney  FMW:[id number] DOB: 01/08/1961 DOA: 09/27/2024 PCP: [facility]  114A/114A-AA  LOS: 3 days   Brief hospital course:   Assessment & Plan: 63 y.o. female with medical history significant of alcohol and tobacco abuse, CKD-3a, depression, adjustment disorder, anemia, recurrent UTI, who presents with diarrhea.   Patient states that she has diarrhea for more than 1 month, with more than 10 times of loose stool bowel movement each day.    Diarrhea --C diff and GI path neg --Imodium PRN --cont MIVF  Electrolyte disturbance including hyponatremia, hypokalemia, hypomagnesemia, hypocalcemia and hypophosphatemia:  --monitor and supplement PRN   Non-anion gap metabolic acidosis  --s/p bicarb gtt  Acute renal failure superimposed on stage 3a chronic kidney disease (HCC):  Baseline creatinine 1.19 on 09/01/2021.  Her creatinine is at 2.01, BUN 30, GFR 27 today.  Likely due to dehydration --cont MIVF   Macrocytic anemia:  --Hgb stable around 9's     Left shoulder pain:  X-ray showed minor acromioclavicular spurring, and slight superior subluxation of the humeral head may represent rotator cuff arthropathy. - Lidoderm  patch    Tobacco use disorder and alcohol abuse: - Due to counseling about importance of quitting smoking and alcohol use - Nicotine  patch --CIWA   Protein-calorie malnutrition, severe:  Underweight, BMI 15.33 --supplements per dietician   DVT prophylaxis: Lovenox SQ Code Status: Full code  Family Communication:  Level of care: Telemetry Medical Dispo:   The patient is from: home Anticipated d/c is to: home Anticipated d/c date is: 1-2 days   Subjective and Interval History:  Pt reported diarrhea slightly improved.  No abdominal pain.   Objective: Vitals:   09/30/24 0015 09/30/24 0421 09/30/24 0830 09/30/24 1626  BP: (!) 94/51 (!) 94/58 (!) 87/49 92/72  Pulse: 96 89 99 92  Resp:  16 17 16   Temp:  98.5 F (36.9 C) 98.7 F (37.1 C) 99.1 F (37.3 C) 98.1 F (36.7 C)  TempSrc:      SpO2: 100% 100% 100% 100%  Weight:      Height:        Intake/Output Summary (Last 24 hours) at 09/30/2024 1919 Last data filed at 09/30/2024 1900 Gross per 24 hour  Intake 480 ml  Output --  Net 480 ml   Filed Weights   09/27/24 1649 09/27/24 2003  Weight: 39.7 kg 43.1 kg    Examination:   Constitutional: NAD, AAOx3 HEENT: conjunctivae and lids normal, EOMI CV: No cyanosis.   RESP: normal respiratory effort, on RA Neuro: II - XII grossly intact.   Psych: Normal mood and affect.  Appropriate judgement and reason   Data Reviewed: I have personally reviewed labs and imaging studies  Time spent: 50 minutes  Ellouise Haber, MD [facility] If 7PM-7AM, please contact night-coverage 09/30/2024, 7:19 PM

## 2024-09-30 NOTE — Progress Notes (Signed)
 CRITICAL VALUE STICKER  CRITICAL VALUE:Potassium 2.6  RECEIVER (on-site recipient of call):Rosina Daring RN  DATE & TIME NOTIFIED: 09/30/2024 @ 0505  MESSENGER (representative from lab):  MD NOTIFIED: VEAR Solian MD  TIME OF NOTIFICATION:0507  RESPONSE:

## 2024-09-30 NOTE — Discharge Instructions (Signed)

## 2024-09-30 NOTE — Progress Notes (Signed)
 PHARMACY CONSULT NOTE - FOLLOW UP  Pharmacy Consult for Electrolyte Monitoring and Replacement   Recent Labs: Potassium (mmol/L)  Date Value  09/30/2024 2.6 (LL)   Magnesium  (mg/dL)  Date Value  89/92/7974 2.6 (H)   Calcium (mg/dL)  Date Value  89/91/7974 6.6 (L)   Albumin (g/dL)  Date Value  89/92/7974 2.2 (L)   Phosphorus (mg/dL)  Date Value  89/91/7974 2.7   Sodium (mmol/L)  Date Value  09/30/2024 134 (L)     Assessment: 10/8 @ 0410:  K = 2.6                         Ca = 6.6,  Alb = 2.2,  Corrected Ca = 8.04  CrCl = 26.8 ml/min   Goal of Therapy:  Electrolytes WNL  Plan:  KCl 10 mEq IV X 4 ordered for 10/8 @ ~ 0600. Calcium gluconate 1 gm IVPB X 1  - recheck electrolytes 1 hr after KCl run complete @ 1100   Brevon Dewald D ,PharmD Clinical Pharmacist 09/30/2024 5:39 AM

## 2024-09-30 NOTE — Progress Notes (Signed)
 PHARMACY CONSULT NOTE - ELECTROLYTES  Pharmacy Consult for Electrolyte Monitoring and Replacement   Recent Labs: Height: 5' 6 (167.6 cm) Weight: 43.1 kg (95 lb) IBW/kg (Calculated) : 59.3 Estimated Creatinine Clearance: 27.4 mL/min (A) (by C-G formula based on SCr of 1.43 mg/dL (H)). Potassium (mmol/L)  Date Value  09/30/2024 3.1 (L)   Magnesium  (mg/dL)  Date Value  89/91/7974 1.8   Calcium (mg/dL)  Date Value  89/91/7974 6.9 (L)   Albumin (g/dL)  Date Value  89/92/7974 2.2 (L)   Phosphorus (mg/dL)  Date Value  89/91/7974 2.7   Sodium (mmol/L)  Date Value  09/30/2024 136   Corrected Ca: 8.3 mg/dL  Assessment  Karen Mooney is a 63 y.o. female presenting with diarrhea. PMH significant for alcohol and tobacco abuse, CKD-3a, depression, adjustment disorder, anemia, recurrent UTI. Pharmacy has been consulted to monitor and replace electrolytes.  Diet: Regular MIVF: NS @ 75 mL/hr Pertinent medications: MVI  Goal of Therapy: Electrolytes WNL  Plan:  K 3.1: MD has ordered KCL 40 mEq PO x2 Check BMP, Mg, Phos with AM labs  Thank you for allowing pharmacy to be a part of this patient's care.  Damien Napoleon, PharmD Clinical Pharmacist 09/30/2024 12:36 PM

## 2024-09-30 NOTE — Progress Notes (Signed)
 Mobility Specialist Progress Note:    09/30/24 1555  Mobility  Activity Stood at bedside;Pivoted/transferred to/from [facility]  Level of Assistance Contact guard assist, steadying assist  Assistive Device None;BSC  Distance Ambulated (ft) 3 ft  Range of Motion/Exercises Active;All extremities  Activity Response Tolerated well  Mobility visit 1 Mobility  Mobility Specialist Start Time (ACUTE ONLY) 1533  Mobility Specialist Stop Time (ACUTE ONLY) 1541  Mobility Specialist Time Calculation (min) (ACUTE ONLY) 8 min   Pt received standing EOB attempting to perform peri care. Required CGA to stand and transfer with no AD. Pt refused further mobility d/t feeling weak. Provided motivation and educated pt that mobility and ambulation will help, still refused. Left pt supine, all needs met.  Sherrilee Ditty Mobility Specialist Please contact via Special educational needs teacher or  Rehab office at [phone]

## 2024-09-30 NOTE — Plan of Care (Signed)
 Patient discharged.

## 2024-10-01 LAB — RENAL FUNCTION PANEL
Albumin: 2 g/dL — ABNORMAL LOW (ref 3.5–5.0)
Anion gap: 8 (ref 5–15)
BUN: 36 mg/dL — ABNORMAL HIGH (ref 8–23)
CO2: 18 mmol/L — ABNORMAL LOW (ref 22–32)
Calcium: 6.7 mg/dL — ABNORMAL LOW (ref 8.9–10.3)
Chloride: 106 mmol/L (ref 98–111)
Creatinine, Ser: 1.49 mg/dL — ABNORMAL HIGH (ref 0.44–1.00)
GFR, Estimated: 39 mL/min — ABNORMAL LOW (ref >60)
Glucose, Bld: 79 mg/dL (ref 70–99)
Phosphorus: 1.9 mg/dL — ABNORMAL LOW (ref 2.5–4.6)
Potassium: 3.9 mmol/L (ref 3.5–5.1)
Sodium: 132 mmol/L — ABNORMAL LOW (ref 135–145)

## 2024-10-01 LAB — ZINC: Zinc: 45 ug/dL (ref 44–115)

## 2024-10-01 LAB — MAGNESIUM: Magnesium: 1.5 mg/dL — ABNORMAL LOW (ref 1.7–2.4)

## 2024-10-01 MED ORDER — MAGNESIUM SULFATE 2 GM/50ML IV SOLN
2.0000 g | Freq: Once | INTRAVENOUS | Status: AC
  Administered 2024-10-01: 2 g via INTRAVENOUS
  Filled 2024-10-01: qty 50

## 2024-10-01 MED ORDER — K PHOS MONO-SOD PHOS DI & MONO 155-852-130 MG PO TABS
250.0000 mg | ORAL_TABLET | Freq: Once | ORAL | Status: AC
  Administered 2024-10-01: 250 mg via ORAL
  Filled 2024-10-01: qty 1

## 2024-10-01 MED ORDER — SODIUM PHOSPHATES 45 MMOLE/15ML IV SOLN
30.0000 mmol | Freq: Once | INTRAVENOUS | Status: AC
  Administered 2024-10-01: 30 mmol via INTRAVENOUS
  Filled 2024-10-01: qty 10

## 2024-10-01 NOTE — Progress Notes (Signed)
  PROGRESS NOTE    Karen Mooney  FMW:[id number] DOB: November 01, 1961 DOA: 09/27/2024 PCP: [facility]  114A/114A-AA  LOS: 4 days   Brief hospital course:   Assessment & Plan: 63 y.o. female with medical history significant of alcohol and tobacco abuse, CKD-3a, depression, adjustment disorder, anemia, recurrent UTI, who presents with diarrhea.   Patient states that she has diarrhea for more than 1 month, with more than 10 times of loose stool bowel movement each day.    Diarrhea --C diff and GI path neg. --Imodium PRN  Electrolyte disturbance including hyponatremia, hypokalemia, hypomagnesemia, hypocalcemia and hypophosphatemia:  --supplement PRN   Non-anion gap metabolic acidosis  --s/p bicarb gtt  Acute renal failure superimposed on stage 3a chronic kidney disease (HCC):  Baseline creatinine 1.19 on 09/01/2021.  Her creatinine is at 2.01, BUN 30, GFR 27 today.  Likely due to dehydration --received IVF.   Macrocytic anemia:  --Hgb stable around 9's     Left shoulder pain:  X-ray showed minor acromioclavicular spurring, and slight superior subluxation of the humeral head may represent rotator cuff arthropathy. - Lidoderm  patch    Tobacco use disorder and alcohol abuse: - Due to counseling about importance of quitting smoking and alcohol use - Nicotine  patch --CIWA   Protein-calorie malnutrition, severe:  Underweight, BMI 15.33 --supplements per dietician   DVT prophylaxis: Lovenox SQ Code Status: Full code  Family Communication:  Level of care: Telemetry Medical Dispo:   The patient is from: home Anticipated d/c is to: home Anticipated d/c date is: tomorrow   Subjective and Interval History:  Pt reported diarrhea improved, and stool firming.   Objective: Vitals:   09/30/24 2000 10/01/24 0402 10/01/24 0723 10/01/24 1600  BP: (!) 93/57 105/79 105/75 (!) 100/59  Pulse: 98 100 93 90  Resp:   16 16  Temp: 98.6 F (37 C) 98 F (36.7 C) 99  F (37.2 C) 97.8 F (36.6 C)  TempSrc: Oral Oral Oral Oral  SpO2:  100% 98% 100%  Weight:      Height:        Intake/Output Summary (Last 24 hours) at 10/01/2024 1909 Last data filed at 10/01/2024 1618 Gross per 24 hour  Intake 191.01 ml  Output --  Net 191.01 ml   Filed Weights   09/27/24 1649 09/27/24 2003  Weight: 39.7 kg 43.1 kg    Examination:   Constitutional: NAD, AAOx3 HEENT: conjunctivae and lids normal, EOMI CV: No cyanosis.   RESP: normal respiratory effort, on RA Neuro: II - XII grossly intact.   Psych: Normal mood and affect.  Appropriate judgement and reason   Data Reviewed: I have personally reviewed labs and imaging studies  Time spent: 35 minutes  Ellouise Haber, MD [facility] If 7PM-7AM, please contact night-coverage 10/01/2024, 7:09 PM

## 2024-10-01 NOTE — Progress Notes (Signed)
 PHARMACY CONSULT NOTE - ELECTROLYTES  Pharmacy Consult for Electrolyte Monitoring and Replacement   Recent Labs: Height: 5' 6 (167.6 cm) Weight: 43.1 kg (95 lb) IBW/kg (Calculated) : 59.3 Estimated Creatinine Clearance: 26.3 mL/min (A) (by C-G formula based on SCr of 1.49 mg/dL (H)). Potassium (mmol/L)  Date Value  10/01/2024 3.9   Magnesium  (mg/dL)  Date Value  89/90/7974 1.5 (L)   Calcium (mg/dL)  Date Value  89/90/7974 6.7 (L)   Albumin (g/dL)  Date Value  89/90/7974 2.0 (L)   Phosphorus (mg/dL)  Date Value  89/90/7974 1.9 (L)   Sodium (mmol/L)  Date Value  10/01/2024 132 (L)   Corrected Ca: 8.3 mg/dL  Assessment  Karen Mooney is a 63 y.o. female presenting with diarrhea. PMH significant for alcohol and tobacco abuse, CKD-3a, depression, adjustment disorder, anemia, recurrent UTI. Pharmacy has been consulted to monitor and replace electrolytes.  Diet: Regular MIVF: N/A Pertinent medications: MVI  Goal of Therapy: Electrolytes WNL  Plan:  Mag 1.5: Give magnesium  sulfate 2 g IV x1 Phos 1.9: Give Kphos neutral 250 mg PO x1 Check BMP, Mg, Phos with AM labs  Thank you for allowing pharmacy to be a part of this patient's care.  Damien Napoleon, PharmD Clinical Pharmacist 10/01/2024 7:11 AM

## 2024-10-01 NOTE — Progress Notes (Signed)
 Nutrition Follow-up  DOCUMENTATION CODES:   Underweight, Severe malnutrition in context of chronic illness  INTERVENTION:   -Continue regular diet for widest variety of meal selections -Continue MVI with minerals daily -Continue 1 mg folic acid daily -Continue 100 mg thiamine  daily -Continue 30 ml Prosource Plus TID, each supplement provides 100 kcals and 15 grams protein -Draw labs to monitor for micronutrient deficiencies secondary to persistent diarrhea: zinc, niacin, folate, and vitamin A  NUTRITION DIAGNOSIS:   Severe Malnutrition related to chronic illness (ETOH abuse) as evidenced by moderate fat depletion, severe fat depletion, moderate muscle depletion, severe muscle depletion.  Ongoing  GOAL:   Patient will meet greater than or equal to 90% of their needs  Progressing   MONITOR:   PO intake, Supplement acceptance  REASON FOR ASSESSMENT:   Consult Assessment of nutrition requirement/status  ASSESSMENT:   Pt with medical history significant of alcohol and tobacco abuse, CKD-3a, depression, adjustment disorder, anemia, recurrent UTI, who presents with diarrhea.  Reviewed I/O's: +480 ml x 24 hours and +2.9 L since admission   Spoke with [name] at bedside, who was pleasant and in good spirits today. No family present. She shares that she feels much better today and that she's ready to get out of here so I can get my hair done.   Her appetite has improved greatly. She has been consuming most of her meals. Meal completions 50-100%. He has been compliant with Prosource supplements, stating she is drinking them to get additional nutrition.   No new wt since last visit.   Per TOC notes, plan to discharge home with medically stable.   Medications reviewed and include lovenox, folic acid, metamucil, and thiamine .   Labs reviewed: Na: 132, Phos: 1.9 (on PO supplementation), Mg: 1.5 (on IV supplementation). CBGS: 108 (inpatient orders for glycemic control are  ). Vitamin A, folate, zinc, and CRP WDL. Niancin level still pending.     Diet Order:   Diet Order             Diet regular Fluid consistency: Thin  Diet effective now                   EDUCATION NEEDS:   Education needs have been addressed  Skin:  Skin Assessment: Reviewed RN Assessment  Last BM:  09/30/24 (type 6)  Height:   Ht Readings from Last 1 Encounters:  09/27/24 5' 6 (1.676 m)    Weight:   Wt Readings from Last 1 Encounters:  09/27/24 43.1 kg    Ideal Body Weight:  59.1 kg  BMI:  Body mass index is 15.33 kg/m.  Estimated Nutritional Needs:   Kcal:  1700-1900  Protein:  85-100 grams  Fluid:  1.7-1.9 L    Karen Mooney, RD, LDN, CDCES Registered Dietitian III Certified Diabetes Care and Education Specialist If unable to reach this RD, please use RD Inpatient group chat on secure chat between hours of 8am-4 pm daily

## 2024-10-01 NOTE — Plan of Care (Signed)

## 2024-10-01 NOTE — Progress Notes (Signed)
 Mobility Specialist - Progress Note    10/01/24 1500  Mobility  Activity Ambulated with assistance  Level of Assistance Independent after set-up  Assistive Device Other (Comment) (IV STand)  Distance Ambulated (ft) 200 ft  Range of Motion/Exercises Active  Activity Response Tolerated well  Mobility visit 1 Mobility  Mobility Specialist Start Time (ACUTE ONLY) 1524  Mobility Specialist Stop Time (ACUTE ONLY) 1540  Mobility Specialist Time Calculation (min) (ACUTE ONLY) 16 min   Pt was supine in bed with HOB elevated and connected to an IV stand. Pt agreed to mobility. Pt was able to EOB independently by using bed features. Pt was able to STS independently with no AD. Pt ambulated well with IV stand. Pt did not need a recovery break. Pt felt good throughout activity. After activity pt returned to the room. Needs were are in reach.   Clem Rodes Mobility Specialist 10/01/24, 3:50 PM

## 2024-10-02 LAB — RENAL FUNCTION PANEL
Albumin: 2 g/dL — ABNORMAL LOW (ref 3.5–5.0)
Anion gap: 7 (ref 5–15)
BUN: 44 mg/dL — ABNORMAL HIGH (ref 8–23)
CO2: 23 mmol/L (ref 22–32)
Calcium: 7.1 mg/dL — ABNORMAL LOW (ref 8.9–10.3)
Chloride: 105 mmol/L (ref 98–111)
Creatinine, Ser: 1.21 mg/dL — ABNORMAL HIGH (ref 0.44–1.00)
GFR, Estimated: 50 mL/min — ABNORMAL LOW (ref >60)
Glucose, Bld: 90 mg/dL (ref 70–99)
Phosphorus: 5.1 mg/dL — ABNORMAL HIGH (ref 2.5–4.6)
Potassium: 3.6 mmol/L (ref 3.5–5.1)
Sodium: 135 mmol/L (ref 135–145)

## 2024-10-02 LAB — MAGNESIUM: Magnesium: 1.6 mg/dL — ABNORMAL LOW (ref 1.7–2.4)

## 2024-10-02 MED ORDER — ADULT MULTIVITAMIN W/MINERALS CH: 1.0000 | ORAL_TABLET | Freq: Every day | ORAL | Status: AC

## 2024-10-02 MED ORDER — ADULT MULTIVITAMIN W/MINERALS CH: 1.0000 | ORAL_TABLET | Freq: Every day | ORAL | 2 refills | Status: DC

## 2024-10-02 MED ORDER — FOLIC ACID 1 MG PO TABS: 1.0000 mg | ORAL_TABLET | Freq: Every day | ORAL | 2 refills | Status: AC

## 2024-10-02 MED ORDER — ZINC OXIDE 40 % EX OINT: TOPICAL_OINTMENT | Freq: Two times a day (BID) | CUTANEOUS | Status: AC

## 2024-10-02 MED ORDER — ENSURE HIGH PROTEIN PO LIQD: 1.0000 | Freq: Two times a day (BID) | ORAL | Status: AC

## 2024-10-02 MED ORDER — MAGNESIUM SULFATE 2 GM/50ML IV SOLN
2.0000 g | Freq: Once | INTRAVENOUS | Status: AC
  Administered 2024-10-02: 2 g via INTRAVENOUS
  Filled 2024-10-02: qty 50

## 2024-10-02 MED ORDER — VITAMIN B-1 100 MG PO TABS: 100.0000 mg | ORAL_TABLET | Freq: Every day | ORAL | 2 refills | Status: AC

## 2024-10-02 NOTE — Progress Notes (Signed)
 Patient wheeled off floor, heading home, accompanied by relative.

## 2024-10-02 NOTE — Progress Notes (Signed)
 PHARMACY CONSULT NOTE - ELECTROLYTES  Pharmacy Consult for Electrolyte Monitoring and Replacement   Recent Labs: Height: 5' 6 (167.6 cm) Weight: 43.1 kg (95 lb) IBW/kg (Calculated) : 59.3 Estimated Creatinine Clearance: 32.4 mL/min (A) (by C-G formula based on SCr of 1.21 mg/dL (H)). Potassium (mmol/L)  Date Value  10/02/2024 3.6   Magnesium  (mg/dL)  Date Value  89/89/7974 1.6 (L)   Calcium (mg/dL)  Date Value  89/89/7974 7.1 (L)   Albumin (g/dL)  Date Value  89/89/7974 2.0 (L)   Phosphorus (mg/dL)  Date Value  89/89/7974 5.1 (H)   Sodium (mmol/L)  Date Value  10/02/2024 135   Corrected Ca: 8.3 mg/dL  Assessment  Karen Mooney is a 63 y.o. female presenting with diarrhea. PMH significant for alcohol and tobacco abuse, CKD-3a, depression, adjustment disorder, anemia, recurrent UTI. Pharmacy has been consulted to monitor and replace electrolytes.  Diet: Regular MIVF: N/A Pertinent medications: MVI  Goal of Therapy: Electrolytes WNL  Plan:  Mag 1.6: give magnesium  sulfate 2 g IV x1 Check BMP, Mg, Phos with AM labs  Thank you for allowing pharmacy to be a part of this patient's care.  Damien Napoleon, PharmD Clinical Pharmacist 10/02/2024 7:21 AM

## 2024-10-02 NOTE — Plan of Care (Signed)

## 2024-10-02 NOTE — Discharge Summary (Signed)
 Physician Discharge Summary   Karen Mooney  female DOB: [birth year]  FMW:[id number]  PCP: [facility]  Admit date: 09/27/2024 Discharge date: 10/02/2024  Admitted From: home Disposition:  home CODE STATUS: Full code   Hospital Course:  For full details, please see H&P, progress notes, consult notes and ancillary notes.  Briefly,  Karen Mooney is a 63 y.o. female with medical history significant of alcohol and tobacco abuse, CKD-3a, who presented with diarrhea.   Patient states that she has diarrhea for more than 1 month, with more than 10 times of loose stool bowel movement each day.    Diarrhea --C diff and GI path neg. --Imodium PRN --diarrhea improved prior to discharge.   Hypokalemia Hypomagnesemia Hypocalcemia hypophosphatemia:  --likely from GI loss.  Supplemented PRN   Hyponatremia, chronic --Na low 130's to 136, actually better than baseline.  Non-anion gap metabolic acidosis, resolved --likely due to GI loss.  s/p bicarb gtt   Acute renal failure superimposed on stage 3a chronic kidney disease (HCC):  Cr 2.01 on presentation, likely due to dehydration.  Improved with IVF, Cr 1.21 prior to discharge.   Macrocytic anemia:  --Hgb stable around 9's   Left shoulder pain:  X-ray showed minor acromioclavicular spurring, and slight superior subluxation of the humeral head may represent rotator cuff arthropathy. - Lidoderm  patch    Tobacco use disorder and alcohol abuse: --monitor on CIWA, no withdrawal symptoms.  Protein-calorie malnutrition, severe:  Underweight, BMI 15.33 --supplements per dietician   Discharge Diagnoses:  Principal Problem:   Electrolyte disturbance Active Problems:   Hyponatremia   Hypokalemia   Hypomagnesemia   Hypocalcemia   Hypophosphatemia   Nausea vomiting and diarrhea   Dehydration   Acute renal failure superimposed on stage 3a chronic kidney disease (HCC)   Metabolic acidosis   Macrocytic  anemia   Left shoulder pain   Tobacco use disorder   Alcohol abuse   Protein-calorie malnutrition, severe   30 Day Unplanned Readmission Risk Score    Flowsheet Row ED to Hosp-Admission (Current) from 09/27/2024 in [facility] 1C MEDICAL TELEMETRY  30 Day Unplanned Readmission Risk Score (%) 16.82 Filed at 10/02/2024 0801    This score is the patient's risk of an unplanned readmission within 30 days of being discharged (0 -100%). The score is based on dignosis, age, lab data, medications, orders, and past utilization.   Low:  0-14.9   Medium: 15-21.9   High: 22-29.9   Extreme: 30 and above         Discharge Instructions:  Allergies as of 10/02/2024   No Known Allergies      Medication List     TAKE these medications    Ensure High Protein Liqd Take 1 Can by mouth 2 (two) times daily between meals.   folic acid 1 MG tablet Commonly known as: FOLVITE Take 1 tablet (1 mg total) by mouth daily.   liver oil-zinc oxide 40 % ointment Commonly known as: DESITIN Apply topically 2 (two) times daily.   multivitamin with minerals Tabs tablet Take 1 tablet by mouth daily.   thiamine  100 MG tablet Commonly known as: Vitamin B-1 Take 1 tablet (100 mg total) by mouth daily.         Follow-up Information     [facility] Follow up in 1 week(s).   Specialty: General Practice Why: hospital follow up Contact information: [address] [place] [address] [phone]  No Known Allergies   The results of significant diagnostics from this hospitalization (including imaging, microbiology, ancillary and laboratory) are listed below for reference.   Consultations:   Procedures/Studies: DG Shoulder Left Result Date: 09/27/2024 CLINICAL DATA:  Atraumatic left shoulder pain. EXAM: LEFT SHOULDER - 2+ VIEW COMPARISON:  None Available. FINDINGS: The bones are subjectively under mineralized there is no  evidence of fracture or dislocation. Minor acromioclavicular spurring. Slight superior subluxation of the humeral head. Soft tissues are unremarkable. IMPRESSION: Minor acromioclavicular spurring. Slight superior subluxation of the humeral head may represent rotator cuff arthropathy. Electronically Signed   By: Andrea Gasman M.D.   On: 09/27/2024 18:46      Labs: BNP (last 3 results) No results for input(s): BNP in the last 8760 hours. Basic Metabolic Panel: Recent Labs  Lab 09/28/24 0528 09/28/24 1434 09/29/24 0440 09/30/24 0410 09/30/24 1140 10/01/24 0509 10/02/24 0536  NA 132*   < > 134* 134* 136 132* 135  K 2.5*   < > 3.0* 2.6* 3.1* 3.9 3.6  CL 106   < > 107 101 99 106 105  CO2 12*   < > 17* 23 25 18* 23  GLUCOSE 103*   < > 96 105* 107* 79 90  BUN 31*   < > 29* 31* 30* 36* 44*  CREATININE 1.76*   < > 1.79* 1.46* 1.43* 1.49* 1.21*  CALCIUM 6.7*   < > 7.0* 6.6* 6.9* 6.7* 7.1*  MG 4.5*  --  2.6* 1.8  --  1.5* 1.6*  PHOS 2.6  --  1.8* 2.7  --  1.9* 5.1*   < > = values in this interval not displayed.   Liver Function Tests: Recent Labs  Lab 09/27/24 1717 09/29/24 0440 10/01/24 0509 10/02/24 0536  AST 24  --   --   --   ALT 13  --   --   --   ALKPHOS 109  --   --   --   BILITOT 1.4*  --   --   --   PROT 7.2  --   --   --   ALBUMIN 2.9* 2.2* 2.0* 2.0*   Recent Labs  Lab 09/27/24 1717  LIPASE 47   No results for input(s): AMMONIA in the last 168 hours. CBC: Recent Labs  Lab 09/27/24 1717 09/28/24 0528  WBC 8.0 5.5  NEUTROABS 6.3  --   HGB 9.4* 9.1*  HCT 27.4* 26.1*  MCV 100.7* 97.8  PLT 351 334   Cardiac Enzymes: No results for input(s): CKTOTAL, CKMB, CKMBINDEX, TROPONINI in the last 168 hours. BNP: Invalid input(s): POCBNP CBG: Recent Labs  Lab 09/29/24 1216  GLUCAP 108*   D-Dimer No results for input(s): DDIMER in the last 72 hours. Hgb A1c No results for input(s): HGBA1C in the last 72 hours. Lipid Profile No results  for input(s): CHOL, HDL, LDLCALC, TRIG, CHOLHDL, LDLDIRECT in the last 72 hours. Thyroid function studies No results for input(s): TSH, T4TOTAL, T3FREE, THYROIDAB in the last 72 hours.  Invalid input(s): FREET3 Anemia work up No results for input(s): VITAMINB12, FOLATE, FERRITIN, TIBC, IRON, RETICCTPCT in the last 72 hours. Urinalysis    Component Value Date/Time   COLORURINE YELLOW 08/27/2021 0006   APPEARANCEUR CLOUDY (A) 08/27/2021 0006   APPEARANCEUR Hazy 09/22/2012 1315   LABSPEC 1.020 08/27/2021 0006   LABSPEC 1.002 09/22/2012 1315   PHURINE 6.0 08/27/2021 0006   GLUCOSEU NEGATIVE 08/27/2021 0006   GLUCOSEU Negative 09/22/2012 1315   HGBUR LARGE (  A) 08/27/2021 0006   BILIRUBINUR NEGATIVE 08/27/2021 0006   BILIRUBINUR Negative 09/22/2012 1315   KETONESUR TRACE (A) 08/27/2021 0006   PROTEINUR >300 (A) 08/27/2021 0006   NITRITE NEGATIVE 08/27/2021 0006   LEUKOCYTESUR LARGE (A) 08/27/2021 0006   LEUKOCYTESUR 2+ 09/22/2012 1315   Sepsis Labs Recent Labs  Lab 09/27/24 1717 09/28/24 0528  WBC 8.0 5.5   Microbiology Recent Results (from the past 240 hours)  Resp panel by RT-PCR (RSV, Flu A&B, Covid) Anterior Nasal Swab     Status: None   Collection Time: 09/28/24 11:59 AM   Specimen: Anterior Nasal Swab  Result Value Ref Range Status   SARS Coronavirus 2 by RT PCR NEGATIVE NEGATIVE Final    Comment: (NOTE) SARS-CoV-2 target nucleic acids are NOT DETECTED.  The SARS-CoV-2 RNA is generally detectable in upper respiratory specimens during the acute phase of infection. The lowest concentration of SARS-CoV-2 viral copies this assay can detect is 138 copies/mL. A negative result does not preclude SARS-Cov-2 infection and should not be used as the sole basis for treatment or other patient management decisions. A negative result may occur with  improper specimen collection/handling, submission of specimen other than nasopharyngeal swab,  presence of viral mutation(s) within the areas targeted by this assay, and inadequate number of viral copies(<138 copies/mL). A negative result must be combined with clinical observations, patient history, and epidemiological information. The expected result is Negative.  Fact Sheet for Patients:  BloggerCourse.com  Fact Sheet for Healthcare Providers:  SeriousBroker.it  This test is no t yet approved or cleared by the [place]  FDA and  has been authorized for detection and/or diagnosis of SARS-CoV-2 by FDA under an Emergency Use Authorization (EUA). This EUA will remain  in effect (meaning this test can be used) for the duration of the COVID-19 declaration under Section 564(b)(1) of the Act, 21 U.S.C.section 360bbb-3(b)(1), unless the authorization is terminated  or revoked sooner.       Influenza A by PCR NEGATIVE NEGATIVE Final   Influenza B by PCR NEGATIVE NEGATIVE Final    Comment: (NOTE) The Xpert Xpress SARS-CoV-2/FLU/RSV plus assay is intended as an aid in the diagnosis of influenza from Nasopharyngeal swab specimens and should not be used as a sole basis for treatment. Nasal washings and aspirates are unacceptable for Xpert Xpress SARS-CoV-2/FLU/RSV testing.  Fact Sheet for Patients: BloggerCourse.com  Fact Sheet for Healthcare Providers: SeriousBroker.it  This test is not yet approved or cleared by the [place]  FDA and has been authorized for detection and/or diagnosis of SARS-CoV-2 by FDA under an Emergency Use Authorization (EUA). This EUA will remain in effect (meaning this test can be used) for the duration of the COVID-19 declaration under Section 564(b)(1) of the Act, 21 U.S.C. section 360bbb-3(b)(1), unless the authorization is terminated or revoked.     Resp Syncytial Virus by PCR NEGATIVE NEGATIVE Final    Comment: (NOTE) Fact Sheet for  Patients: BloggerCourse.com  Fact Sheet for Healthcare Providers: SeriousBroker.it  This test is not yet approved or cleared by the [place]  FDA and has been authorized for detection and/or diagnosis of SARS-CoV-2 by FDA under an Emergency Use Authorization (EUA). This EUA will remain in effect (meaning this test can be used) for the duration of the COVID-19 declaration under Section 564(b)(1) of the Act, 21 U.S.C. section 360bbb-3(b)(1), unless the authorization is terminated or revoked.  Performed at [facility], [address] [place], [place] [address]   C Difficile Quick Screen w PCR  reflex     Status: None   Collection Time: 09/30/24 11:00 AM   Specimen: STOOL  Result Value Ref Range Status   C Diff antigen NEGATIVE NEGATIVE Final   C Diff toxin NEGATIVE NEGATIVE Final   C Diff interpretation No C. difficile detected.  Final    Comment: Performed at [facility], [address] [place], [place] [address]  Gastrointestinal Panel by PCR , Stool     Status: None   Collection Time: 09/30/24 11:00 AM   Specimen: Stool  Result Value Ref Range Status   Campylobacter species NOT DETECTED NOT DETECTED Final   Plesimonas shigelloides NOT DETECTED NOT DETECTED Final   Salmonella species NOT DETECTED NOT DETECTED Final   Yersinia enterocolitica NOT DETECTED NOT DETECTED Final   Vibrio species NOT DETECTED NOT DETECTED Final   Vibrio cholerae NOT DETECTED NOT DETECTED Final   Enteroaggregative E coli (EAEC) NOT DETECTED NOT DETECTED Final   Enteropathogenic E coli (EPEC) NOT DETECTED NOT DETECTED Final   Enterotoxigenic E coli (ETEC) NOT DETECTED NOT DETECTED Final   Shiga like toxin producing E coli (STEC) NOT DETECTED NOT DETECTED Final   Shigella/Enteroinvasive E coli (EIEC) NOT DETECTED NOT DETECTED Final   Cryptosporidium NOT DETECTED NOT DETECTED Final   Cyclospora cayetanensis NOT DETECTED NOT  DETECTED Final   Entamoeba histolytica NOT DETECTED NOT DETECTED Final   Giardia lamblia NOT DETECTED NOT DETECTED Final   Adenovirus F40/41 NOT DETECTED NOT DETECTED Final   Astrovirus NOT DETECTED NOT DETECTED Final   Norovirus GI/GII NOT DETECTED NOT DETECTED Final   Rotavirus A NOT DETECTED NOT DETECTED Final   Sapovirus (I, II, IV, and V) NOT DETECTED NOT DETECTED Final    Comment: Performed at [facility], [address] [place], [place] [address]     Total time spend on discharging this patient, including the last patient exam, discussing the hospital stay, instructions for ongoing care as it relates to all pertinent caregivers, as well as preparing the medical discharge records, prescriptions, and/or referrals as applicable, is 30 minutes.    Ellouise Haber, MD  [facility] 10/02/2024, 9:03 AM

## 2024-10-06 LAB — MISC LABCORP TEST (SEND OUT): Labcorp test code: 70115
